# Patient Record
Sex: Male | Born: 1970 | Race: Black or African American | Hispanic: No | Marital: Married | Smoking: Never smoker
Health system: Southern US, Community
[De-identification: ages and names within clinical notes are randomized; demographics above are authoritative.]

## PROBLEM LIST (undated history)

## (undated) DIAGNOSIS — N529 Male erectile dysfunction, unspecified: Secondary | ICD-10-CM

## (undated) DIAGNOSIS — A549 Gonococcal infection, unspecified: Secondary | ICD-10-CM

## (undated) DIAGNOSIS — R7303 Prediabetes: Secondary | ICD-10-CM

## (undated) DIAGNOSIS — E559 Vitamin D deficiency, unspecified: Secondary | ICD-10-CM

## (undated) HISTORY — DX: Vitamin D deficiency, unspecified: E55.9

## (undated) HISTORY — DX: Prediabetes: R73.03

## (undated) HISTORY — DX: Male erectile dysfunction, unspecified: N52.9

## (undated) HISTORY — PX: CYST EXCISION: SHX5701

---

## 1998-12-07 ENCOUNTER — Emergency Department (HOSPITAL_COMMUNITY): Admission: EM | Admit: 1998-12-07 | Discharge: 1998-12-07 | Payer: Self-pay | Admitting: Internal Medicine

## 2009-04-28 ENCOUNTER — Emergency Department (HOSPITAL_COMMUNITY): Admission: EM | Admit: 2009-04-28 | Discharge: 2009-04-28 | Payer: Self-pay | Admitting: Emergency Medicine

## 2009-05-01 ENCOUNTER — Emergency Department (HOSPITAL_COMMUNITY): Admission: EM | Admit: 2009-05-01 | Discharge: 2009-05-01 | Payer: Self-pay | Admitting: Internal Medicine

## 2009-07-08 ENCOUNTER — Emergency Department (HOSPITAL_COMMUNITY)
Admission: EM | Admit: 2009-07-08 | Discharge: 2009-07-08 | Payer: Self-pay | Source: Home / Self Care | Admitting: Emergency Medicine

## 2009-10-10 ENCOUNTER — Emergency Department (HOSPITAL_COMMUNITY): Admission: EM | Admit: 2009-10-10 | Discharge: 2009-10-10 | Payer: Self-pay | Admitting: Emergency Medicine

## 2010-04-27 ENCOUNTER — Emergency Department (HOSPITAL_COMMUNITY)
Admission: EM | Admit: 2010-04-27 | Discharge: 2010-04-27 | Payer: Self-pay | Source: Home / Self Care | Admitting: Emergency Medicine

## 2010-05-10 ENCOUNTER — Emergency Department (HOSPITAL_COMMUNITY)
Admission: EM | Admit: 2010-05-10 | Discharge: 2010-05-10 | Payer: Self-pay | Source: Home / Self Care | Admitting: Emergency Medicine

## 2010-10-29 DIAGNOSIS — L723 Sebaceous cyst: Secondary | ICD-10-CM | POA: Insufficient documentation

## 2010-10-29 DIAGNOSIS — H61119 Acquired deformity of pinna, unspecified ear: Secondary | ICD-10-CM | POA: Insufficient documentation

## 2011-01-26 DIAGNOSIS — J309 Allergic rhinitis, unspecified: Secondary | ICD-10-CM | POA: Insufficient documentation

## 2011-02-01 DIAGNOSIS — D72819 Decreased white blood cell count, unspecified: Secondary | ICD-10-CM | POA: Insufficient documentation

## 2011-02-02 ENCOUNTER — Encounter (HOSPITAL_BASED_OUTPATIENT_CLINIC_OR_DEPARTMENT_OTHER)
Admission: RE | Admit: 2011-02-02 | Discharge: 2011-02-02 | Disposition: A | Discharge status: Home / Self Care | Payer: Medicaid Other | Source: Ambulatory Visit | Attending: Plastic Surgery | Admitting: Plastic Surgery

## 2011-02-03 ENCOUNTER — Ambulatory Visit (HOSPITAL_BASED_OUTPATIENT_CLINIC_OR_DEPARTMENT_OTHER)
Admission: RE | Admit: 2011-02-03 | Discharge: 2011-02-03 | Disposition: A | Discharge status: Home / Self Care | Payer: Medicaid Other | Source: Ambulatory Visit | Attending: Plastic Surgery | Admitting: Plastic Surgery

## 2011-02-03 DIAGNOSIS — Z01810 Encounter for preprocedural cardiovascular examination: Secondary | ICD-10-CM | POA: Insufficient documentation

## 2011-02-03 DIAGNOSIS — J449 Chronic obstructive pulmonary disease, unspecified: Secondary | ICD-10-CM | POA: Insufficient documentation

## 2011-02-03 DIAGNOSIS — M9510 Cauliflower ear, unspecified ear: Secondary | ICD-10-CM | POA: Insufficient documentation

## 2011-02-03 DIAGNOSIS — Z01812 Encounter for preprocedural laboratory examination: Secondary | ICD-10-CM | POA: Insufficient documentation

## 2011-02-03 DIAGNOSIS — L723 Sebaceous cyst: Secondary | ICD-10-CM | POA: Insufficient documentation

## 2011-02-03 DIAGNOSIS — J4489 Other specified chronic obstructive pulmonary disease: Secondary | ICD-10-CM | POA: Insufficient documentation

## 2011-02-04 ENCOUNTER — Other Ambulatory Visit: Payer: Self-pay | Admitting: Plastic Surgery

## 2011-02-04 NOTE — Op Note (Signed)
  NAME:  Barry Wood, Barry Wood NO.:  [id number]  MEDICAL RECORD NO.:  [id number]  LOCATION:  [facility]   FACILITY:[facility]  PHYSICIAN:  Wayland Denis, DO      DATE OF BIRTH:  1970-06-13  DATE OF PROCEDURE:  02/03/2011 DATE OF DISCHARGE:                              OPERATIVE REPORT   PREOPERATIVE DIAGNOSES: 1. Forehead cyst. 2. Scarred cartilage cauliflower ear, right.  POSTOPERATIVE DIAGNOSES: 1. Forehead cyst. 2. Scarred cartilage cauliflower ear, right.  PROCEDURE:  Excision of forehead cyst 1 cm and excision of antihelix, right ear cartilage.  ATTENDING:  Wayland Denis, DO  ANESTHESIA:  General.  INDICATIONS FOR PROCEDURE:  The patient is a 40 year old black male who has noticed a growing cyst of the forehead over the last year.  He also required a cauliflower ear for multiple injuries secondary to wrestling when he was in high school.  The decision was made to excise the excess portion of the cartilage.  He did not want  cartilage graft or rib graft, but only improvement based on what could be done locally and excision of the forehead cyst to identify the nature of the cyst.  He was seen in the holding, risks and complications were explained.  DESCRIPTION OF PROCEDURE:  The patient was taken to the operating room, placed on the operating table in the supine position.  General anesthesia was administered once adequate time-out was called, all information was confirmed to be correct.  His head was prepped and draped with Betadine in the usual sterile fashion including the right ear and forehead.  He was draped.  Local with epinephrine was injected around the cyst and around the antihelix of the right ear.  The 15-blade was used to make a linear incision over the cyst and the tenotomies were used to dissect down around it, it was very firm and the entire capsule was removed.  Hemostasis was achieved using electrocautery.  The incision was closed  with 6-0 Prolene simple and interrupted sutures.  A dry 2 x 2 gauze was applied.  Attention was then turned towards the right ear, 15-blade was used to make an incision along the anterior inner portion of the helix at the junction of the helix and antihelix. The tenotomies were used to dissect the skin free from the antihelix. The bur was used to create valley between the helix and the antihelix. A 15-blade was used to excise the excess portion of the antihelix anterior and posterior roots.  It was then smoothed with the bur.  The incision was closed with running 6-0 Prolene and simple interrupted 6-0 Prolene.  Xeroform was then tucked into the fold and secured with 4-0 nylon with Xeroform behind the ear.  A dry dressing with 4x4 gauze was applied and taped to both the cyst gauze and the ear gauze and the cyst was sent to Pathology.  The patient tolerated the procedure well.  There were no complications.  He was awoken and taken to recovery room in stable condition.     Wayland Denis, DO     CS/MEDQ  D:  02/03/2011  T:  02/04/2011  Job:  [id number] Electronically Signed by Wayland Denis  on 02/04/2011 03:40:01 PM

## 2011-03-03 DIAGNOSIS — M542 Cervicalgia: Secondary | ICD-10-CM | POA: Insufficient documentation

## 2011-08-11 ENCOUNTER — Encounter (HOSPITAL_COMMUNITY): Payer: Self-pay | Admitting: Emergency Medicine

## 2011-08-11 ENCOUNTER — Emergency Department (INDEPENDENT_AMBULATORY_CARE_PROVIDER_SITE_OTHER)
Admission: EM | Admit: 2011-08-11 | Discharge: 2011-08-11 | Disposition: A | Discharge status: Home / Self Care | Payer: Self-pay | Source: Home / Self Care | Attending: Emergency Medicine | Admitting: Emergency Medicine

## 2011-08-11 DIAGNOSIS — N489 Disorder of penis, unspecified: Secondary | ICD-10-CM

## 2011-08-11 DIAGNOSIS — R21 Rash and other nonspecific skin eruption: Secondary | ICD-10-CM

## 2011-08-11 HISTORY — DX: Gonococcal infection, unspecified: A54.9

## 2011-08-11 LAB — RPR: RPR Ser Ql: NONREACTIVE

## 2011-08-11 MED ORDER — PENICILLIN G BENZATHINE 1200000 UNIT/2ML IM SUSP
INTRAMUSCULAR | Status: AC
  Filled 2011-08-11: qty 4

## 2011-08-11 MED ORDER — PENICILLIN G BENZATHINE 1200000 UNIT/2ML IM SUSP
2.4000 10*6.[IU] | Freq: Once | INTRAMUSCULAR | Status: AC
  Administered 2011-08-11: 2.4 10*6.[IU] via INTRAMUSCULAR

## 2011-08-11 NOTE — Discharge Instructions (Signed)
Give [place] a working phone number so that we can contact you if needed. You will need to return here in person for some of your lab results. If any of your labs come back positive he will need to have your partner(s) are treated as well. Refrain from sexual contact until you know all of your results, and your symptoms resolve. Return for fever above 100.4, if you get worse, if you've difficulty urinating, or for any other concerns. You also need to find a primary care physician for routine health care.  Go to www.goodrx.com to look up your medications. This will give you a list of where you can find your prescriptions at the most affordable prices.   RESOURCE GUIDE  No Primary Care Doctor Call [facility]  [phone] Other agencies that provide inexpensive medical care    [facility]  [phone]    [facility]  [phone]    [facility]  [phone]    [facility]  [phone] [address] [place] [address]    [facility]  [phone]    [facility]  [phone] [facility] [phone]   [address] [place] [address] Suite [place], [place] [address]   [facility] Resources  [facility]     [employer]                          [facility]. [address] [place]                       [address]      [address] [place] [address]   [phone] (After Hours)

## 2011-08-11 NOTE — ED Provider Notes (Signed)
History     CSN: [id number]  Arrival date & time 08/11/11  4098   First MD Initiated Contact with Patient 08/11/11 0820      Chief Complaint  Patient presents with  . Rash    (Consider location/radiation/quality/duration/timing/severity/associated sxs/prior treatment) HPI Comments: Patient with a painless, circular lesion on his penis appeared about 2 weeks ago and then resolved. Patient states that the rash came back, now with more lesions. Has been applying peroxide, alcohol, bacitracin on this, and states that the rash is now painful because of this. No blisters, penile discharge, nausea, vomiting, fevers, other rash, sore throat, or inguinal lymphadenopathy. Patient sexually active with his wife, who has a [employer] fast streamer. Patient states that he feels the IUD when they have intercourse, but is not sure if this is causing his symptoms. She is asymptomatic. He denies sexual contact with anybody else. States that he is not using any condoms, no new lotions, soaps, or detergents. Patient has remote history of gonorrhea. No history of Chlamydia, herpes, HIV, syphilis.  ROS as noted in HPI. All other ROS negative.   Patient is a 41 y.o. male presenting with penile discharge. The history is provided by the patient.  Penile Discharge This is a recurrent problem. The current episode started more than 1 week ago. The problem occurs constantly. Pertinent negatives include no abdominal pain. The symptoms are aggravated by nothing. The symptoms are relieved by nothing. Treatments tried: Peroxide, alcohol. The treatment provided no relief.    Past Medical History  Diagnosis Date  . Gonorrhea     History reviewed. No pertinent past surgical history.  History reviewed. No pertinent family history.  History  Substance Use Topics  . Smoking status: Never Smoker   . Smokeless tobacco: Not on file  . Alcohol Use: Yes      Review of Systems  Constitutional: Negative for fever and unexpected weight  change.  Gastrointestinal: Negative for abdominal pain.  Genitourinary: Negative for dysuria, frequency, flank pain, discharge, penile swelling, scrotal swelling, difficulty urinating, penile pain and testicular pain.  Skin: Positive for rash.  Hematological: Negative for adenopathy.    Allergies  Review of patient's allergies indicates no known allergies.  Home Medications   Current Outpatient Rx  Name Route Sig Dispense Refill  . BACITRACIN-NEOMYCIN-POLYMYXIN [id number] EX OINT Topical Apply topically every 12 (twelve) hours. apply to eye      BP 109/68  Pulse 68  Temp(Src) 98.2 F (36.8 C) (Oral)  Resp 16  SpO2 99%  Physical Exam  Nursing note and vitals reviewed. Constitutional: He is oriented to person, place, and time. He appears well-developed and well-nourished.  HENT:  Head: Normocephalic and atraumatic.  Eyes: Conjunctivae and EOM are normal.  Neck: Normal range of motion.  Cardiovascular: Regular rhythm.   Pulmonary/Chest: Effort normal. No respiratory distress.  Abdominal: He exhibits no distension.  Genitourinary: Testes normal. Uncircumcised. No penile erythema or penile tenderness. No discharge found.       Flat, circular, hyperpigmented, nontender lesions on penis. No blisters. Glans  normal. No penile discharge. Pt declined chaperone.   Musculoskeletal: Normal range of motion. He exhibits no edema and no tenderness.  Lymphadenopathy:       Right: No inguinal adenopathy present.       Left: No inguinal adenopathy present.  Neurological: He is alert and oriented to person, place, and time.  Skin: Skin is warm and dry. No rash noted.       No rash on torso, arms, palms  of hand  Psychiatric: He has a normal mood and affect. His behavior is normal.    ED Course  Procedures (including critical care time)   Labs Reviewed  GC/CHLAMYDIA PROBE AMP, GENITAL  HERPES SIMPLEX VIRUS CULTURE  HIV ANTIBODY (ROUTINE TESTING)  RPR   No results found.   1.  Penile rash     MDM  Previous records reviewed, noncontributory.  Sending off gonorrhea, Chlamydia, GC culture, RPR, HIV. Patient states that the lesions are painless until he started using the alcohol and peroxide on it. Exam is suggestive syphilis. No rash on palms of hands, no inguinal lymphadenopathy. Does not appear to be herpes at this point in time. Giving 2.4 million units of penicillin IM for empiric treatment of syphilis ersed with. Will have patient return here for his results. Advised him to bring his partner in for treatment if any of his results come back positive.  Luiz Blare, MD 08/11/11 [phone]

## 2011-08-11 NOTE — ED Notes (Signed)
Rash onset two days ago.  Reports bumps, blisters.  Rash is painful, denies itching, reports burning.  Reports a previous episode 2 weeks ago, went away.  Then reocured in same spot.  Area is on penis

## 2011-08-11 NOTE — ED Notes (Signed)
Patient aware he will wait 20- 30 minutes post antibiotic injection.

## 2011-08-12 LAB — GC/CHLAMYDIA PROBE AMP, GENITAL: GC Probe Amp, Genital: NEGATIVE

## 2011-08-15 LAB — HERPES SIMPLEX VIRUS CULTURE: Culture: NOT DETECTED

## 2011-08-16 ENCOUNTER — Telehealth (HOSPITAL_COMMUNITY): Payer: Self-pay | Admitting: *Deleted
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
## 2011-08-17 ENCOUNTER — Telehealth (HOSPITAL_COMMUNITY): Payer: Self-pay | Admitting: *Deleted

## 2011-08-17 NOTE — ED Notes (Signed)
Pt. called back and given the information in the previous note. Pt. satisfied with the answer. No further questions. Barry Wood 08/17/2011

## 2011-08-17 NOTE — ED Notes (Signed)
Discussed with Dr. Chaney Malling and she said rash could have been a contact dermatitis.  Things that can cause dermatitis would be soaps, lubricants, condoms etc. I told her, that he thought it was the strings on the [employer]. and she said if they are short they could be sticking him.  Tell pt. it is nothing to worry about. She checked for all the things he would not want it to be like STD's. No sex until the rash clears up. I called pt. and left message to call. Vassie Moselle 08/17/2011

## 2012-05-29 ENCOUNTER — Emergency Department (INDEPENDENT_AMBULATORY_CARE_PROVIDER_SITE_OTHER)
Admission: EM | Admit: 2012-05-29 | Discharge: 2012-05-29 | Disposition: A | Discharge status: Home / Self Care | Payer: BC Managed Care – PPO | Source: Home / Self Care | Attending: Family Medicine | Admitting: Family Medicine

## 2012-05-29 ENCOUNTER — Encounter (HOSPITAL_COMMUNITY): Payer: Self-pay

## 2012-05-29 DIAGNOSIS — J111 Influenza due to unidentified influenza virus with other respiratory manifestations: Secondary | ICD-10-CM

## 2012-05-29 MED ORDER — ACETAMINOPHEN 500 MG PO TABS
1000.0000 mg | ORAL_TABLET | Freq: Once | ORAL | Status: AC
  Administered 2012-05-29: 1000 mg via ORAL

## 2012-05-29 MED ORDER — ACETAMINOPHEN 325 MG PO TABS
ORAL_TABLET | ORAL | Status: AC
  Filled 2012-05-29: qty 3

## 2012-05-29 NOTE — ED Notes (Signed)
Cough, HA, congested; fever on evaluation

## 2012-05-29 NOTE — ED Provider Notes (Signed)
History     CSN: [id number]  Arrival date & time 05/29/12  1712   First MD Initiated Contact with Patient 05/29/12 1716      Chief Complaint  Patient presents with  . Cough    (Consider location/radiation/quality/duration/timing/severity/associated sxs/prior treatment) Patient is a 42 y.o. male presenting with cough. The history is provided by the patient.  Cough This is a new problem. The current episode started more than 2 days ago. The problem has been gradually worsening. The cough is non-productive. The maximum temperature recorded prior to his arrival was 101 to 101.9 F. Associated symptoms include chills, rhinorrhea and myalgias. Pertinent negatives include no sore throat, no shortness of breath and no wheezing.    Past Medical History  Diagnosis Date  . Gonorrhea     History reviewed. No pertinent past surgical history.  History reviewed. No pertinent family history.  History  Substance Use Topics  . Smoking status: Never Smoker   . Smokeless tobacco: Not on file  . Alcohol Use: Yes      Review of Systems  Constitutional: Positive for fever and chills.  HENT: Positive for congestion, rhinorrhea and postnasal drip. Negative for sore throat and trouble swallowing.   Respiratory: Positive for cough. Negative for shortness of breath and wheezing.   Gastrointestinal: Positive for nausea. Negative for vomiting and diarrhea.  Musculoskeletal: Positive for myalgias.    Allergies  Review of patient's allergies indicates no known allergies.  Home Medications   Current Outpatient Rx  Name  Route  Sig  Dispense  Refill  . BACITRACIN-NEOMYCIN-POLYMYXIN [id number] EX OINT   Topical   Apply topically every 12 (twelve) hours. apply to eye           BP 113/79  Pulse 73  Temp 101 F (38.3 C) (Oral)  Resp 18  SpO2 98%  Physical Exam  Nursing note and vitals reviewed. Constitutional: He is oriented to person, place, and time. He appears well-developed and  well-nourished.  HENT:  Head: Normocephalic.  Right Ear: External ear normal.  Left Ear: External ear normal.  Nose: Mucosal edema and rhinorrhea present.  Mouth/Throat: Oropharynx is clear and moist.  Eyes: Pupils are equal, round, and reactive to light.  Neck: Normal range of motion. Neck supple.  Cardiovascular: Normal rate, regular rhythm, normal heart sounds and intact distal pulses.   Pulmonary/Chest: Effort normal and breath sounds normal.  Abdominal: Soft. Bowel sounds are normal.  Lymphadenopathy:    He has no cervical adenopathy.  Neurological: He is alert and oriented to person, place, and time.  Skin: Skin is warm and dry.    ED Course  Procedures (including critical care time)  Labs Reviewed - No data to display No results found.   1. Influenza-like illness       MDM          Linna Hoff, MD 05/29/12 [phone]

## 2016-10-04 ENCOUNTER — Ambulatory Visit: Payer: Self-pay | Admitting: Family Medicine

## 2017-01-12 DIAGNOSIS — R002 Palpitations: Secondary | ICD-10-CM | POA: Diagnosis not present

## 2017-01-12 DIAGNOSIS — J301 Allergic rhinitis due to pollen: Secondary | ICD-10-CM | POA: Diagnosis not present

## 2018-04-17 DIAGNOSIS — N529 Male erectile dysfunction, unspecified: Secondary | ICD-10-CM | POA: Diagnosis not present

## 2018-04-17 DIAGNOSIS — Z13220 Encounter for screening for lipoid disorders: Secondary | ICD-10-CM | POA: Diagnosis not present

## 2018-04-17 DIAGNOSIS — Z1329 Encounter for screening for other suspected endocrine disorder: Secondary | ICD-10-CM | POA: Diagnosis not present

## 2018-04-17 DIAGNOSIS — R5382 Chronic fatigue, unspecified: Secondary | ICD-10-CM | POA: Diagnosis not present

## 2018-04-17 DIAGNOSIS — Z131 Encounter for screening for diabetes mellitus: Secondary | ICD-10-CM | POA: Diagnosis not present

## 2018-04-17 DIAGNOSIS — H6062 Unspecified chronic otitis externa, left ear: Secondary | ICD-10-CM | POA: Diagnosis not present

## 2018-04-17 DIAGNOSIS — H6092 Unspecified otitis externa, left ear: Secondary | ICD-10-CM | POA: Diagnosis not present

## 2018-04-17 DIAGNOSIS — E559 Vitamin D deficiency, unspecified: Secondary | ICD-10-CM | POA: Diagnosis not present

## 2018-07-05 ENCOUNTER — Other Ambulatory Visit: Payer: Self-pay

## 2018-07-05 ENCOUNTER — Encounter (HOSPITAL_COMMUNITY): Payer: Self-pay | Admitting: Emergency Medicine

## 2018-07-05 ENCOUNTER — Emergency Department (HOSPITAL_COMMUNITY): Payer: Medicaid Other

## 2018-07-05 ENCOUNTER — Emergency Department (HOSPITAL_COMMUNITY)
Admission: EM | Admit: 2018-07-05 | Discharge: 2018-07-05 | Disposition: A | Discharge status: Home / Self Care | Payer: Medicaid Other | Attending: Emergency Medicine | Admitting: Emergency Medicine

## 2018-07-05 DIAGNOSIS — Y999 Unspecified external cause status: Secondary | ICD-10-CM | POA: Diagnosis not present

## 2018-07-05 DIAGNOSIS — Y929 Unspecified place or not applicable: Secondary | ICD-10-CM | POA: Diagnosis not present

## 2018-07-05 DIAGNOSIS — Y939 Activity, unspecified: Secondary | ICD-10-CM | POA: Insufficient documentation

## 2018-07-05 DIAGNOSIS — S161XXA Strain of muscle, fascia and tendon at neck level, initial encounter: Secondary | ICD-10-CM | POA: Diagnosis not present

## 2018-07-05 DIAGNOSIS — S199XXA Unspecified injury of neck, initial encounter: Secondary | ICD-10-CM | POA: Diagnosis not present

## 2018-07-05 DIAGNOSIS — S299XXA Unspecified injury of thorax, initial encounter: Secondary | ICD-10-CM | POA: Diagnosis not present

## 2018-07-05 DIAGNOSIS — Z79899 Other long term (current) drug therapy: Secondary | ICD-10-CM | POA: Diagnosis not present

## 2018-07-05 DIAGNOSIS — M546 Pain in thoracic spine: Secondary | ICD-10-CM | POA: Diagnosis not present

## 2018-07-05 DIAGNOSIS — M549 Dorsalgia, unspecified: Secondary | ICD-10-CM

## 2018-07-05 DIAGNOSIS — M542 Cervicalgia: Secondary | ICD-10-CM | POA: Diagnosis not present

## 2018-07-05 MED ORDER — METHOCARBAMOL 500 MG PO TABS: 500.0000 mg | ORAL_TABLET | Freq: Three times a day (TID) | ORAL | 0 refills | Status: DC

## 2018-07-05 MED ORDER — IBUPROFEN 600 MG PO TABS: 600.0000 mg | ORAL_TABLET | Freq: Three times a day (TID) | ORAL | 0 refills | Status: DC

## 2018-07-05 MED ORDER — TRAMADOL HCL 50 MG PO TABS: 50.0000 mg | ORAL_TABLET | Freq: Four times a day (QID) | ORAL | 0 refills | Status: DC | PRN
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
## 2018-07-05 NOTE — Discharge Instructions (Addendum)
Alternate ice and heat to your upper back and neck. Take the medication as directed.  Follow-up with your primary doctor for recheck if your symptoms are not improving in a few days.

## 2019-04-30 ENCOUNTER — Other Ambulatory Visit: Payer: Self-pay

## 2019-04-30 ENCOUNTER — Ambulatory Visit (INDEPENDENT_AMBULATORY_CARE_PROVIDER_SITE_OTHER): Payer: Medicaid Other | Admitting: Nurse Practitioner

## 2019-04-30 ENCOUNTER — Other Ambulatory Visit (INDEPENDENT_AMBULATORY_CARE_PROVIDER_SITE_OTHER): Payer: Self-pay | Admitting: Nurse Practitioner

## 2019-04-30 ENCOUNTER — Encounter (INDEPENDENT_AMBULATORY_CARE_PROVIDER_SITE_OTHER): Payer: Self-pay | Admitting: Nurse Practitioner

## 2019-04-30 DIAGNOSIS — Z13220 Encounter for screening for lipoid disorders: Secondary | ICD-10-CM | POA: Diagnosis not present

## 2019-04-30 DIAGNOSIS — N529 Male erectile dysfunction, unspecified: Secondary | ICD-10-CM | POA: Diagnosis not present

## 2019-04-30 DIAGNOSIS — E559 Vitamin D deficiency, unspecified: Secondary | ICD-10-CM | POA: Diagnosis not present

## 2019-04-30 DIAGNOSIS — Z0001 Encounter for general adult medical examination with abnormal findings: Secondary | ICD-10-CM | POA: Diagnosis not present

## 2019-04-30 DIAGNOSIS — Z113 Encounter for screening for infections with a predominantly sexual mode of transmission: Secondary | ICD-10-CM | POA: Diagnosis not present

## 2019-04-30 DIAGNOSIS — Z139 Encounter for screening, unspecified: Secondary | ICD-10-CM | POA: Diagnosis not present

## 2019-04-30 DIAGNOSIS — Z131 Encounter for screening for diabetes mellitus: Secondary | ICD-10-CM | POA: Diagnosis not present

## 2019-04-30 DIAGNOSIS — Z1329 Encounter for screening for other suspected endocrine disorder: Secondary | ICD-10-CM | POA: Diagnosis not present

## 2019-04-30 NOTE — Progress Notes (Signed)
Subjective:  Patient ID: Barry Wood, male    DOB: 01/29/1971  Age: 48 y.o. MRN: [id number]  CC:  Chief Complaint  Patient presents with  . Annual Exam      HPI  This patient arrives today for annual physical exam.    He is due for flu and tetanus shot.  He had sexual transmitted infection screening completed in 2013, but would be willing to undergo additional screening today.  He does not smoke cigarettes.  Depression screening is due today.  He is due for diabetes screening.  He would qualify for hepatitis C screening.  He also complains of erectile dysfunction that has been bothering him for some time now.  He believes he may have had low testosterone in the past.  He describes difficulty achieving and maintaining an erection.  He does tell me he is able to ejaculate.  Past Medical History:  Diagnosis Date  . Erectile dysfunction   . Gonorrhea       Family History  Problem Relation Age of Onset  . Heart disease Mother   . Cancer Mother        Kidney  . Heart disease Father     Social History   Social History Narrative  . Not on file   Social History   Tobacco Use  . Smoking status: Never Smoker  . Smokeless tobacco: Never Used  Substance Use Topics  . Alcohol use: Yes    Alcohol/week: 4.0 standard drinks    Types: 4 Shots of liquor per week    Comment: Occasional     No outpatient medications have been marked as taking for the 04/30/19 encounter (Office Visit) with Ailene Ards, NP.    ROS:  Review of Systems  Genitourinary:       Difficulty achieving and maintaining an erection. Negative for difficulties/abnormalties associated with ejaculation.  All other systems reviewed and are negative.    Objective:   Today's Vitals: BP 130/76   Pulse 70   Temp 97.9 F (36.6 C)   Resp 14   Ht 5\' 9"  (1.753 m)   Wt 194 lb 9.6 oz (88.3 kg)   SpO2 99%   BMI 28.74 kg/m  Vitals with BMI 04/30/2019 07/05/2018 05/29/2012  Height 5\' 9"  5\' 9"  -   Weight 194 lbs 10 oz 185 lbs -  BMI 10.17 51.02 -  Systolic 585 277 824  Diastolic 76 70 79  Pulse 70 68 73     Physical Exam Vitals signs reviewed.  Constitutional:      Appearance: Normal appearance.  HENT:     Head: Normocephalic and atraumatic.  Eyes:     General: No scleral icterus.       Right eye: No discharge.        Left eye: No discharge.     Extraocular Movements: Extraocular movements intact.     Conjunctiva/sclera: Conjunctivae normal.  Neck:     Musculoskeletal: Neck supple.  Cardiovascular:     Rate and Rhythm: Normal rate and regular rhythm.  Pulmonary:     Effort: Pulmonary effort is normal.     Breath sounds: Normal breath sounds.  Abdominal:     General: Abdomen is flat. Bowel sounds are normal. There is no distension.     Palpations: Abdomen is soft. There is no mass.     Tenderness: There is no abdominal tenderness.  Musculoskeletal: Normal range of motion.  Skin:    General: Skin is warm  and dry.  Neurological:     Mental Status: He is alert and oriented to person, place, and time.  Psychiatric:        Mood and Affect: Mood normal.        Behavior: Behavior normal.        Thought Content: Thought content normal.        Judgment: Judgment normal.          Assessment   1. Encounter for general adult medical examination with abnormal findings   2. Erectile dysfunction, unspecified erectile dysfunction type       Tests ordered No orders of the defined types were placed in this encounter.    Plan: Please see assessment and plan per problem list below.   No orders of the defined types were placed in this encounter.   Patient to follow-up in 1 month to discuss his lab results and determine treatment plan for erectile dysfunction.  He will be due for his annual physical exam in 1 year.[name]  Elenore Paddy, NP

## 2019-04-30 NOTE — Assessment & Plan Note (Signed)
Patient declined to have flu shot today.  Tetanus shot will be administered.  Blood work will be collected today including sexual transmitted infection screening, CBC, CMP, lipid panel, A1c, TSH, vitamin D level, and testosterone level.  Depression screening was negative today.  We will hold off on hepatitis C screening per patient preference.

## 2019-04-30 NOTE — Assessment & Plan Note (Addendum)
We will collect testosterone level for further evaluation today.  Further recommendations may be made based upon those results.  I did discuss with the patient that there are multiple possible etiologies of his erectile dysfunction.  These can include psychiatric, hormonal, and vascular etiologies.  It may include further follow-up and testing to determine the etiology.  He tells me he understands.

## 2019-05-01 LAB — HEMOGLOBIN A1C W/OUT EAG: Hgb A1c MFr Bld: 5.8 % of total Hgb — ABNORMAL HIGH (ref <5.7)

## 2019-05-02 ENCOUNTER — Telehealth (INDEPENDENT_AMBULATORY_CARE_PROVIDER_SITE_OTHER): Payer: Self-pay | Admitting: Nurse Practitioner

## 2019-05-02 NOTE — Telephone Encounter (Signed)
I attempted to call this patient today to discuss his lab work results over the phone.  As well as to reschedule him for office visit.  He did not answer the phone but I did leave a message asking him to call us back.

## 2019-05-13 ENCOUNTER — Telehealth (INDEPENDENT_AMBULATORY_CARE_PROVIDER_SITE_OTHER): Payer: Self-pay | Admitting: Nurse Practitioner

## 2019-05-13 NOTE — Telephone Encounter (Signed)
I attempted to call this patient today to discuss his lab results.  I also needed to call him to reschedule his next follow-up.  He did not answer the phone but I did leave a message asking him to call us back.

## 2019-06-06 ENCOUNTER — Encounter (INDEPENDENT_AMBULATORY_CARE_PROVIDER_SITE_OTHER): Payer: Self-pay | Admitting: Internal Medicine

## 2019-06-06 ENCOUNTER — Ambulatory Visit (INDEPENDENT_AMBULATORY_CARE_PROVIDER_SITE_OTHER): Payer: Medicaid Other | Admitting: Internal Medicine

## 2019-06-06 DIAGNOSIS — R7303 Prediabetes: Secondary | ICD-10-CM | POA: Diagnosis not present

## 2019-06-06 DIAGNOSIS — N529 Male erectile dysfunction, unspecified: Secondary | ICD-10-CM

## 2019-06-06 DIAGNOSIS — E559 Vitamin D deficiency, unspecified: Secondary | ICD-10-CM

## 2019-06-06 NOTE — Progress Notes (Signed)
Metrics: Intervention Frequency ACO  Documented Smoking Status Yearly  Screened one or more times in 24 months  Cessation Counseling or  Active cessation medication Past 24 months  Past 24 months   Guideline developer: UpToDate (See UpToDate for funding source) Date Released: 2014       Wellness Office Visit  Subjective:  Patient ID: Barry Wood, male    DOB: 12-21-1970  Age: 49 y.o. MRN: [id number]  CC: This is an audio telemedicine visit with the permission of the patient who is at home and I am in my office.  I was able to easily recognize his voice on the phone. This visit is to discuss his blood work from his annual physical exam he had with [name]. HPI I discussed all his blood work in detail.  He is prediabetic with a hemoglobin A1c of 5.8%.  He also has vitamin D deficiency. He also has suboptimal testosterone levels. He has mild hyperlipidemia.  Past Medical History:  Diagnosis Date  . Erectile dysfunction   . Gonorrhea       Family History  Problem Relation Age of Onset  . Heart disease Mother   . Cancer Mother        Kidney  . Heart disease Father     Social History   Social History Narrative  . Not on file   Social History   Tobacco Use  . Smoking status: Never Smoker  . Smokeless tobacco: Never Used  Substance Use Topics  . Alcohol use: Yes    Alcohol/week: 4.0 standard drinks    Types: 4 Shots of liquor per week    Comment: Occasional    Current Meds  Medication Sig  . [DISCONTINUED] ibuprofen (ADVIL,MOTRIN) 600 MG tablet Take 1 tablet (600 mg total) by mouth 3 (three) times daily.  . [DISCONTINUED] methocarbamol (ROBAXIN) 500 MG tablet Take 1 tablet (500 mg total) by mouth 3 (three) times daily.  . [DISCONTINUED] neomycin-bacitracin-polymyxin (NEOSPORIN) ointment Apply topically every 12 (twelve) hours. apply to eye  . [DISCONTINUED] traMADol (ULTRAM) 50 MG tablet Take 1 tablet (50 mg total) by mouth every 6 (six) hours as needed.       Objective:   Today's Vitals: There were no vitals taken for this visit. Vitals with BMI 04/30/2019 07/05/2018 05/29/2012  Height 5\' 9"  5\' 9"  -  Weight 194 lbs 10 oz 185 lbs -  BMI 10.25 85.27 -  Systolic 782 423 536  Diastolic 76 70 79  Pulse 70 68 73     Physical Exam   His speech was normal on the phone and he appeared to be alert and orientated.    Assessment   1. Erectile dysfunction, unspecified erectile dysfunction type   2. Prediabetes   3. Vitamin D deficiency disease       Tests ordered No orders of the defined types were placed in this encounter.    Plan: 1. I recommended that he start taking vitamin D3 10,000 units daily. 2. We will need to work on diet in terms of his prediabetic state and we will discuss this in person on the next visit. 3. Also, he may benefit from testosterone therapy and we have addressed this issue more than 2 years ago but I think he was lost to follow-up. 4. Therefore, I will see him in the next 2 to 3 weeks time to discuss testosterone therapy in more detail. 5. This phone call lasted 10 minutes.   No orders of the defined types were  placed in this encounter.   Wilson Singer, MD

## 2019-07-03 ENCOUNTER — Ambulatory Visit (INDEPENDENT_AMBULATORY_CARE_PROVIDER_SITE_OTHER): Payer: Medicaid Other | Admitting: Internal Medicine

## 2019-07-03 ENCOUNTER — Other Ambulatory Visit: Payer: Self-pay

## 2019-07-03 ENCOUNTER — Encounter (INDEPENDENT_AMBULATORY_CARE_PROVIDER_SITE_OTHER): Payer: Self-pay | Admitting: Internal Medicine

## 2019-07-03 VITALS — BP 130/80 | HR 64 | Temp 97.2°F | Resp 18 | Ht 69.0 in | Wt 199.8 lb

## 2019-07-03 DIAGNOSIS — E559 Vitamin D deficiency, unspecified: Secondary | ICD-10-CM | POA: Diagnosis not present

## 2019-07-03 DIAGNOSIS — Z125 Encounter for screening for malignant neoplasm of prostate: Secondary | ICD-10-CM | POA: Diagnosis not present

## 2019-07-03 DIAGNOSIS — R7303 Prediabetes: Secondary | ICD-10-CM | POA: Diagnosis not present

## 2019-07-03 DIAGNOSIS — N529 Male erectile dysfunction, unspecified: Secondary | ICD-10-CM | POA: Diagnosis not present

## 2019-07-03 MED ORDER — TADALAFIL 20 MG PO TABS: 20.0000 mg | ORAL_TABLET | ORAL | 6 refills | Status: DC | PRN

## 2019-07-03 NOTE — Progress Notes (Signed)
Metrics: Intervention Frequency ACO  Documented Smoking Status Yearly  Screened one or more times in 24 months  Cessation Counseling or  Active cessation medication Past 24 months  Past 24 months   Guideline developer: UpToDate (See UpToDate for funding source) Date Released: 2014       Wellness Office Visit  Subjective:  Patient ID: Barry Wood, male    DOB: Jun 26, 1970  Age: 49 y.o. MRN: [id number]  CC: Erectile dysfunction HPI This man was seen by [name] previously and he has vitamin D deficiency and I recommended he start taking vitamin D3 10,000 units daily which she has been taking. His main complaint has been erectile dysfunction which she has had for some time.  He says he cannot get a hard erection which will last. He is also prediabetic and we have discussed to some degree nutrition in the past. In the past, his testosterone levels were mid normal range.  He has not had them checked for more than 2 years.  Past Medical History:  Diagnosis Date  . Erectile dysfunction   . Gonorrhea       Family History  Problem Relation Age of Onset  . Heart disease Mother   . Cancer Mother        Kidney  . 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   Social History   Tobacco Use  . Smoking status: Never Smoker  . Smokeless tobacco: Never Used  Substance Use Topics  . Alcohol use: Yes    Alcohol/week: 4.0 standard drinks    Types: 4 Shots of liquor per week    Comment: Occasional    Current Meds  Medication Sig  . Cholecalciferol (VITAMIN D-3) 125 MCG (5000 UT) TABS Take 2 tablets by mouth daily.       Objective:   Today's Vitals: BP 130/80 (BP Location: Left Arm, Patient Position: Sitting, Cuff Size: Normal)   Pulse 64   Temp (!) 97.2 F (36.2 C) (Temporal)   Resp 18   Ht 5\' 9"  (1.753 m)   Wt 199 lb 12.8 oz (90.6 kg)   SpO2 98% Comment: wearing mask   BMI 29.51 kg/m  Vitals with BMI 07/03/2019 06/06/2019 04/30/2019  Height 5\' 9"  (No Data) 5\' 9"   Weight 199 lbs 13 oz (No Data) 194 lbs 10 oz  BMI 56.43 - 32.95  Systolic 188 (No Data) 416  Diastolic 80 (No Data) 76  Pulse 64 - 70     Physical Exam  He looks systemically well.  No new physical findings.     Assessment   1. Erectile dysfunction, unspecified erectile dysfunction type   2. Prediabetes   3. Vitamin D deficiency disease   4. Special screening for malignant neoplasm of prostate       Tests ordered Orders Placed This Encounter  Procedures  . Testosterone Total,Free,Bio, Males  . PSA  . COMPLETE METABOLIC PANEL WITH GFR  . VITAMIN D 25 Hydroxy (Vit-D Deficiency, Fractures)     Plan: 1. Blood work is ordered as above. 2. I am going to try him on Cialis for erectile dysfunction to see if this will help him. 3. Further recommendations will depend on blood results. 4. Follow-up in 6 weeks time.   Meds ordered this encounter  Medications  . tadalafil (CIALIS) 20 MG tablet    Sig: Take 1 tablet (20 mg total) by mouth every other  day as needed for erectile dysfunction.    Dispense:  5 tablet    Refill:  6    Jacquelin Krajewski Normajean Glasgow, MD

## 2019-07-04 ENCOUNTER — Other Ambulatory Visit (INDEPENDENT_AMBULATORY_CARE_PROVIDER_SITE_OTHER): Payer: Self-pay

## 2019-07-04 LAB — COMPLETE METABOLIC PANEL WITH GFR
AG Ratio: 1.6 (calc) (ref 1.0–2.5)
ALT: 27 U/L (ref 9–46)
AST: 23 U/L (ref 10–40)
Albumin: 4.4 g/dL (ref 3.6–5.1)
Alkaline phosphatase (APISO): 76 U/L (ref 36–130)
BUN: 16 mg/dL (ref 7–25)
CO2: 28 mmol/L (ref 20–32)
Calcium: 9.5 mg/dL (ref 8.6–10.3)
Chloride: 104 mmol/L (ref 98–110)
Creat: 1.16 mg/dL (ref 0.60–1.35)
GFR, Est African American: 85 mL/min/{1.73_m2} (ref >=60)
GFR, Est Non African American: 74 mL/min/{1.73_m2} (ref >=60)
Globulin: 2.8 g/dL (calc) (ref 1.9–3.7)
Glucose, Bld: 89 mg/dL (ref 65–99)
Potassium: 4.2 mmol/L (ref 3.5–5.3)
Sodium: 138 mmol/L (ref 135–146)
Total Bilirubin: 0.4 mg/dL (ref 0.2–1.2)
Total Protein: 7.2 g/dL (ref 6.1–8.1)

## 2019-07-04 LAB — TESTOSTERONE TOTAL,FREE,BIO, MALES
Albumin: 4.4 g/dL (ref 3.6–5.1)
Sex Hormone Binding: 50 nmol/L (ref 10–50)
Testosterone, Bioavailable: 108.8 ng/dL — ABNORMAL LOW (ref >=110.0)
Testosterone, Free: 54.1 pg/mL (ref 46.0–224.0)
Testosterone: 564 ng/dL (ref 250–827)

## 2019-07-04 LAB — VITAMIN D 25 HYDROXY (VIT D DEFICIENCY, FRACTURES): Vit D, 25-Hydroxy: 20 ng/mL — ABNORMAL LOW (ref 30–100)

## 2019-07-04 LAB — PSA: PSA: 0.2 ng/mL (ref <=4.0)

## 2019-07-04 MED ORDER — TADALAFIL 20 MG PO TABS: 20.0000 mg | ORAL_TABLET | ORAL | 6 refills | Status: DC | PRN

## 2019-07-08 NOTE — Progress Notes (Signed)
Patient called.  Was given lab results on Vitamin D levels ar in 20s. He needs to take 10,000 units/ daily. He will take in morning with breakfast.

## 2019-08-12 ENCOUNTER — Encounter (INDEPENDENT_AMBULATORY_CARE_PROVIDER_SITE_OTHER): Payer: Self-pay | Admitting: Internal Medicine

## 2019-08-12 ENCOUNTER — Ambulatory Visit (INDEPENDENT_AMBULATORY_CARE_PROVIDER_SITE_OTHER): Payer: Medicaid Other | Admitting: Internal Medicine

## 2019-08-12 ENCOUNTER — Other Ambulatory Visit: Payer: Self-pay

## 2019-08-12 VITALS — BP 118/80 | HR 85 | Temp 98.8°F | Ht 69.0 in | Wt 198.0 lb

## 2019-08-12 DIAGNOSIS — E559 Vitamin D deficiency, unspecified: Secondary | ICD-10-CM | POA: Diagnosis not present

## 2019-08-12 DIAGNOSIS — N529 Male erectile dysfunction, unspecified: Secondary | ICD-10-CM | POA: Diagnosis not present

## 2019-08-12 LAB — COMPLETE METABOLIC PANEL WITH GFR
AG Ratio: 1.6 (calc) (ref 1.0–2.5)
ALT: 16 U/L (ref 9–46)
AST: 20 U/L (ref 10–40)
Albumin: 4.4 g/dL (ref 3.6–5.1)
Alkaline phosphatase (APISO): 62 U/L (ref 36–130)
BUN: 19 mg/dL (ref 7–25)
CO2: 27 mmol/L (ref 20–32)
Calcium: 9.7 mg/dL (ref 8.6–10.3)
Chloride: 105 mmol/L (ref 98–110)
Creat: 1.24 mg/dL (ref 0.60–1.35)
GFR, Est African American: 79 mL/min/{1.73_m2} (ref >=60)
GFR, Est Non African American: 68 mL/min/{1.73_m2} (ref >=60)
Globulin: 2.7 g/dL (calc) (ref 1.9–3.7)
Glucose, Bld: 106 mg/dL — ABNORMAL HIGH (ref 65–99)
Potassium: 4.4 mmol/L (ref 3.5–5.3)
Sodium: 139 mmol/L (ref 135–146)
Total Bilirubin: 0.4 mg/dL (ref 0.2–1.2)
Total Protein: 7.1 g/dL (ref 6.1–8.1)

## 2019-08-12 LAB — VITAMIN D 25 HYDROXY (VIT D DEFICIENCY, FRACTURES): Vit D, 25-Hydroxy: 45 ng/mL (ref 30–100)

## 2019-08-12 NOTE — Progress Notes (Signed)
Metrics: Intervention Frequency ACO  Documented Smoking Status Yearly  Screened one or more times in 24 months  Cessation Counseling or  Active cessation medication Past 24 months  Past 24 months   Guideline developer: UpToDate (See UpToDate for funding source) Date Released: 2014       Wellness Office Visit  Subjective:  Patient ID: Barry Wood, male    DOB: Jan 24, 1971  Age: 49 y.o. MRN: [id number]  CC: This man comes in for follow-up of vitamin D deficiency and erectile dysfunction. HPI  I had started him on Cialis on the last visit and this is helped him significantly. He has been taking vitamin D3 10,000 units daily.  He has tolerated this dose. Past Medical History:  Diagnosis Date  . Erectile dysfunction   . Gonorrhea       Family History  Problem Relation Age of Onset  . Heart disease Mother   . Cancer Mother        Kidney  . 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   Social History   Tobacco Use  . Smoking status: Never Smoker  . Smokeless tobacco: Never Used  Substance Use Topics  . Alcohol use: Yes    Alcohol/week: 4.0 standard drinks    Types: 4 Shots of liquor per week    Comment: Occasional    Current Meds  Medication Sig  . Cholecalciferol (VITAMIN D-3) 125 MCG (5000 UT) TABS Take 2 tablets by mouth daily.  . tadalafil (CIALIS) 20 MG tablet Take 1 tablet (20 mg total) by mouth every other day as needed for erectile dysfunction.       Objective:   Today's Vitals: BP 118/80 (BP Location: Left Arm, Patient Position: Sitting, Cuff Size: Normal)   Pulse 85   Temp 98.8 F (37.1 C) (Temporal)   Ht 5\' 9"  (1.753 m)   Wt 198 lb (89.8 kg)   SpO2 99%   BMI 29.24 kg/m  Vitals with BMI 08/12/2019 07/03/2019 06/06/2019  Height 5\' 9"  5\' 9"  (No Data)  Weight 198 lbs 199 lbs 13 oz (No Data)  BMI 29.23 29.49 -  Systolic 118 130 (No Data)    Diastolic 80 80 (No Data)  Pulse 85 64 -     Physical Exam  He looks systemically well, remains overweight.  No other new physical findings.     Assessment   1. Vitamin D deficiency disease   2. Erectile dysfunction, unspecified erectile dysfunction type       Tests ordered Orders Placed This Encounter  Procedures  . COMPLETE METABOLIC PANEL WITH GFR  . VITAMIN D 25 Hydroxy (Vit-D Deficiency, Fractures)     Plan: 1. Blood work is ordered above and he will continue with vitamin D3 10,000 units daily. 2. He will continue with Cialis for erectile dysfunction as needed. 3. Further recommendations will depend on blood results and I will see him in about 4 months time for follow-up.   No orders of the defined types were placed in this encounter.   06/08/2019, MD

## 2019-08-13 ENCOUNTER — Encounter (INDEPENDENT_AMBULATORY_CARE_PROVIDER_SITE_OTHER): Payer: Self-pay | Admitting: Internal Medicine

## 2019-12-17 ENCOUNTER — Other Ambulatory Visit: Payer: Self-pay

## 2019-12-17 ENCOUNTER — Ambulatory Visit (INDEPENDENT_AMBULATORY_CARE_PROVIDER_SITE_OTHER): Payer: Medicaid Other | Admitting: Internal Medicine

## 2019-12-17 ENCOUNTER — Encounter (INDEPENDENT_AMBULATORY_CARE_PROVIDER_SITE_OTHER): Payer: Self-pay | Admitting: Internal Medicine

## 2019-12-17 VITALS — BP 120/80 | HR 65 | Temp 97.9°F | Ht 69.0 in | Wt 189.2 lb

## 2019-12-17 DIAGNOSIS — E559 Vitamin D deficiency, unspecified: Secondary | ICD-10-CM | POA: Diagnosis not present

## 2019-12-17 DIAGNOSIS — N529 Male erectile dysfunction, unspecified: Secondary | ICD-10-CM

## 2019-12-17 MED ORDER — TADALAFIL 20 MG PO TABS: 20.0000 mg | ORAL_TABLET | ORAL | 3 refills | Status: DC | PRN

## 2019-12-17 NOTE — Progress Notes (Signed)
Metrics: Intervention Frequency ACO  Documented Smoking Status Yearly  Screened one or more times in 24 months  Cessation Counseling or  Active cessation medication Past 24 months  Past 24 months   Guideline developer: UpToDate (See UpToDate for funding source) Date Released: 2014       Wellness Office Visit  Subjective:  Patient ID: Barry Wood, male    DOB: 20-Oct-1970  Age: 49 y.o. MRN: [id number]  CC: This man comes in for follow-up of vitamin D deficiency and erectile dysfunction. HPI  I had given him a prescription for Cialis and he tells me that this is tremendously helped his erectile dysfunction. He continues on vitamin D 3 supplementation for vitamin D deficiency and is doing well with this. He is also improved his diet and has lost weight.  He plans to begin exercising on a regular basis. Past Medical History:  Diagnosis Date  . Erectile dysfunction   . Gonorrhea    Past Surgical History:  Procedure Laterality Date  . CYST EXCISION       Family History  Problem Relation Age of Onset  . Heart disease Mother   . Cancer Mother        Kidney  . 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   Social History   Tobacco Use  . Smoking status: Never Smoker  . Smokeless tobacco: Never Used  Substance Use Topics  . Alcohol use: Yes    Alcohol/week: 4.0 standard drinks    Types: 4 Shots of liquor per week    Comment: Occasional    Current Meds  Medication Sig  . Cholecalciferol (VITAMIN D-3) 125 MCG (5000 UT) TABS Take 2 tablets by mouth daily.  . tadalafil (CIALIS) 20 MG tablet Take 1 tablet (20 mg total) by mouth every other day as needed for erectile dysfunction.  . [DISCONTINUED] tadalafil (CIALIS) 20 MG tablet Take 1 tablet (20 mg total) by mouth every other day as needed for erectile dysfunction.     Depression screen [facility] 2/9 08/12/2019 04/30/2019    Decreased Interest 0 0  Down, Depressed, Hopeless 0 0  PHQ - 2 Score 0 0     Objective:   Today's Vitals: BP 120/80 (BP Location: Left Arm, Patient Position: Sitting, Cuff Size: Normal)   Pulse 65   Temp 97.9 F (36.6 C) (Temporal)   Ht 5\' 9"  (1.753 m)   Wt 189 lb 3.2 oz (85.8 kg)   SpO2 98%   BMI 27.94 kg/m  Vitals with BMI 12/17/2019 08/12/2019 07/03/2019  Height 5\' 9"  5\' 9"  5\' 9"   Weight 189 lbs 3 oz 198 lbs 199 lbs 13 oz  BMI 27.93 29.23 29.49  Systolic 120 118 08/31/2019  Diastolic 80 80 80  Pulse 65 85 64     Physical Exam   He looks systemically well.  He has lost 9 pounds in weight.  Blood pressure very good.  Alert and orientated without any focal neurological signs.    Assessment   1. Erectile dysfunction, unspecified erectile dysfunction type   2. Vitamin D deficiency disease       Tests ordered No orders of the defined types were placed in this encounter.    Plan: 1. I gave him another prescription of Cialis sufficient for his use. 2. He will continue with vitamin D3 supplementation for vitamin D deficiency. 3. I did briefly discuss testosterone therapy  and I think he may benefit from this.  He will discuss this further with his wife. 4. I will see him in December for an annual physical exam.   Meds ordered this encounter  Medications  . tadalafil (CIALIS) 20 MG tablet    Sig: Take 1 tablet (20 mg total) by mouth every other day as needed for erectile dysfunction.    Dispense:  60 tablet    Refill:  3    Camar Guyton Normajean Glasgow, MD

## 2020-01-31 DIAGNOSIS — Z23 Encounter for immunization: Secondary | ICD-10-CM | POA: Diagnosis not present

## 2020-02-21 DIAGNOSIS — Z23 Encounter for immunization: Secondary | ICD-10-CM | POA: Diagnosis not present

## 2020-05-11 ENCOUNTER — Encounter (INDEPENDENT_AMBULATORY_CARE_PROVIDER_SITE_OTHER): Payer: Medicaid Other | Admitting: Nurse Practitioner

## 2020-05-12 ENCOUNTER — Encounter (INDEPENDENT_AMBULATORY_CARE_PROVIDER_SITE_OTHER): Payer: Medicaid Other | Admitting: Internal Medicine

## 2020-05-18 ENCOUNTER — Encounter (INDEPENDENT_AMBULATORY_CARE_PROVIDER_SITE_OTHER): Payer: Medicaid Other | Admitting: Internal Medicine

## 2020-06-25 ENCOUNTER — Encounter (INDEPENDENT_AMBULATORY_CARE_PROVIDER_SITE_OTHER): Payer: Medicaid Other | Admitting: Internal Medicine

## 2020-07-23 ENCOUNTER — Encounter (INDEPENDENT_AMBULATORY_CARE_PROVIDER_SITE_OTHER): Payer: Medicaid Other | Admitting: Internal Medicine

## 2020-07-27 ENCOUNTER — Other Ambulatory Visit (INDEPENDENT_AMBULATORY_CARE_PROVIDER_SITE_OTHER): Payer: Self-pay | Admitting: Internal Medicine

## 2020-07-27 MED ORDER — TADALAFIL 20 MG PO TABS: 20.0000 mg | ORAL_TABLET | ORAL | 3 refills | Status: DC | PRN

## 2020-08-25 ENCOUNTER — Ambulatory Visit (INDEPENDENT_AMBULATORY_CARE_PROVIDER_SITE_OTHER): Payer: Medicaid Other | Admitting: Internal Medicine

## 2020-08-25 ENCOUNTER — Other Ambulatory Visit: Payer: Self-pay

## 2020-08-25 ENCOUNTER — Encounter (INDEPENDENT_AMBULATORY_CARE_PROVIDER_SITE_OTHER): Payer: Self-pay | Admitting: Internal Medicine

## 2020-08-25 VITALS — BP 122/78 | HR 66 | Temp 97.9°F | Ht 68.0 in | Wt 190.4 lb

## 2020-08-25 DIAGNOSIS — Z1211 Encounter for screening for malignant neoplasm of colon: Secondary | ICD-10-CM

## 2020-08-25 DIAGNOSIS — Z125 Encounter for screening for malignant neoplasm of prostate: Secondary | ICD-10-CM | POA: Diagnosis not present

## 2020-08-25 DIAGNOSIS — R5381 Other malaise: Secondary | ICD-10-CM | POA: Diagnosis not present

## 2020-08-25 DIAGNOSIS — N529 Male erectile dysfunction, unspecified: Secondary | ICD-10-CM | POA: Diagnosis not present

## 2020-08-25 DIAGNOSIS — R5383 Other fatigue: Secondary | ICD-10-CM | POA: Diagnosis not present

## 2020-08-25 DIAGNOSIS — R7303 Prediabetes: Secondary | ICD-10-CM | POA: Diagnosis not present

## 2020-08-25 DIAGNOSIS — E559 Vitamin D deficiency, unspecified: Secondary | ICD-10-CM

## 2020-08-25 DIAGNOSIS — Z1159 Encounter for screening for other viral diseases: Secondary | ICD-10-CM | POA: Diagnosis not present

## 2020-08-25 NOTE — Progress Notes (Signed)
Metrics: Intervention Frequency ACO  Documented Smoking Status Yearly  Screened one or more times in 24 months  Cessation Counseling or  Active cessation medication Past 24 months  Past 24 months   Guideline developer: UpToDate (See UpToDate for funding source) Date Released: 2014       Wellness Office Visit  Subjective:  Patient ID: Barry Wood, male    DOB: 1970/10/30  Age: 50 y.o. MRN: [id number]  CC: This man comes in for follow-up of vitamin D deficiency, erectile dysfunction and prediabetes.The appointment was scheduled for an annual physical exam but his insurance is unlikely to pay for this. HPI  He continues on vitamin D3 10,000 units daily for vitamin D deficiency.  He continues on Cialis for his erectile dysfunction . Previously he was found to be prediabetic but this has not been followed up. Past Medical History:  Diagnosis Date  . Erectile dysfunction   . Gonorrhea    Past Surgical History:  Procedure Laterality Date  . CYST EXCISION       Family History  Problem Relation Age of Onset  . Heart disease Mother   . Cancer Mother        Kidney  . 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   Social History   Tobacco Use  . Smoking status: Never Smoker  . Smokeless tobacco: Never Used  Substance Use Topics  . Alcohol use: Yes    Alcohol/week: 4.0 standard drinks    Types: 4 Shots of liquor per week    Comment: Occasional    Current Meds  Medication Sig  . Cholecalciferol (VITAMIN D-3) 125 MCG (5000 UT) TABS Take 2 tablets by mouth daily.  . MULTIPLE VITAMIN PO Take 1 capsule by mouth daily.  . tadalafil (CIALIS) 20 MG tablet Take 1 tablet (20 mg total) by mouth every other day as needed for erectile dysfunction.     Flowsheet Row Office Visit from 08/25/2020 in [facility]  PHQ-9 Total Score 0      Objective:   Today's Vitals: BP  122/78   Pulse 66   Temp 97.9 F (36.6 C) (Temporal)   Ht 5\' 8"  (1.727 m)   Wt 190 lb 6.4 oz (86.4 kg)   SpO2 98%   BMI 28.95 kg/m  Vitals with BMI 08/25/2020 12/17/2019 08/12/2019  Height 5\' 8"  5\' 9"  5\' 9"   Weight 190 lbs 6 oz 189 lbs 3 oz 198 lbs  BMI 28.96 27.93 29.23  Systolic 122 120 08/14/2019  Diastolic 78 80 80  Pulse 66 65 85     Physical Exam  Eric systemically well, and somewhat overweight.  Blood pressure in a good range.     Assessment   1. Colon cancer screening   2. Prediabetes   3. Vitamin D deficiency disease   4. Malaise and fatigue   5. Special screening for malignant neoplasm of prostate   6. Encounter for hepatitis C screening test for low risk patient   7. Erectile dysfunction, unspecified erectile dysfunction type       Tests ordered Orders Placed This Encounter  Procedures  . CBC  . COMPLETE METABOLIC PANEL WITH GFR  . Hemoglobin A1c  . Lipid panel  . PSA, Total with Reflex to PSA, Free  . TSH  . VITAMIN D 25 Hydroxy (Vit-D Deficiency, Fractures)  . Hepatitis C antibody  . Ambulatory referral to  Gastroenterology     Plan: 1. He will continue with vitamin D3 10,000 units daily and we will check levels today. 2. Other blood work is ordered. 3. I will refer him to gastroenterology for a screening colonoscopy which he is due for at his age.   No orders of the defined types were placed in this encounter.   Wilson Singer, MD

## 2020-08-26 LAB — CBC
HCT: 38.7 % (ref 38.5–50.0)
Hemoglobin: 13.2 g/dL (ref 13.2–17.1)
MCH: 32.7 pg (ref 27.0–33.0)
MCHC: 34.1 g/dL (ref 32.0–36.0)
MCV: 95.8 fL (ref 80.0–100.0)
MPV: 9.5 fL (ref 7.5–12.5)
Platelets: 266 10*3/uL (ref 140–400)
RBC: 4.04 10*6/uL — ABNORMAL LOW (ref 4.20–5.80)
RDW: 12.5 % (ref 11.0–15.0)
WBC: 4.8 10*3/uL (ref 3.8–10.8)

## 2020-08-26 LAB — COMPLETE METABOLIC PANEL WITH GFR
AG Ratio: 1.8 (calc) (ref 1.0–2.5)
ALT: 22 U/L (ref 9–46)
AST: 19 U/L (ref 10–35)
Albumin: 4.6 g/dL (ref 3.6–5.1)
Alkaline phosphatase (APISO): 66 U/L (ref 35–144)
BUN: 18 mg/dL (ref 7–25)
CO2: 26 mmol/L (ref 20–32)
Calcium: 9.4 mg/dL (ref 8.6–10.3)
Chloride: 104 mmol/L (ref 98–110)
Creat: 1.22 mg/dL (ref 0.70–1.33)
GFR, Est African American: 79 mL/min/{1.73_m2} (ref >=60)
GFR, Est Non African American: 68 mL/min/{1.73_m2} (ref >=60)
Globulin: 2.5 g/dL (calc) (ref 1.9–3.7)
Glucose, Bld: 94 mg/dL (ref 65–99)
Potassium: 4.3 mmol/L (ref 3.5–5.3)
Sodium: 137 mmol/L (ref 135–146)
Total Bilirubin: 0.3 mg/dL (ref 0.2–1.2)
Total Protein: 7.1 g/dL (ref 6.1–8.1)

## 2020-08-26 LAB — LIPID PANEL
Cholesterol: 166 mg/dL (ref <200)
HDL: 43 mg/dL (ref >=40)
LDL Cholesterol (Calc): 100 mg/dL (calc) — ABNORMAL HIGH
Non-HDL Cholesterol (Calc): 123 mg/dL (calc) (ref <130)
Total CHOL/HDL Ratio: 3.9 (calc) (ref <5.0)
Triglycerides: 133 mg/dL (ref <150)

## 2020-08-26 LAB — HEMOGLOBIN A1C
Hgb A1c MFr Bld: 5.5 % of total Hgb (ref <5.7)
Mean Plasma Glucose: 111 mg/dL
eAG (mmol/L): 6.2 mmol/L

## 2020-08-26 LAB — TSH: TSH: 1.07 mIU/L (ref 0.40–4.50)

## 2020-08-26 LAB — VITAMIN D 25 HYDROXY (VIT D DEFICIENCY, FRACTURES): Vit D, 25-Hydroxy: 27 ng/mL — ABNORMAL LOW (ref 30–100)

## 2020-08-26 LAB — HEPATITIS C ANTIBODY
Hepatitis C Ab: NONREACTIVE
SIGNAL TO CUT-OFF: 0.06 (ref <1.00)

## 2020-08-26 LAB — PSA, TOTAL WITH REFLEX TO PSA, FREE: PSA, Total: 0.2 ng/mL (ref <=4.0)

## 2020-08-28 ENCOUNTER — Encounter: Payer: Self-pay | Admitting: Internal Medicine

## 2020-08-31 NOTE — Progress Notes (Signed)
Please call this patient and let him know that his Vit D levels are very low-start taking Vitamin D3 10,000 units/day. Rest of the bloodwork is okay.

## 2020-11-03 ENCOUNTER — Encounter: Payer: Self-pay | Admitting: Internal Medicine

## 2020-11-03 ENCOUNTER — Ambulatory Visit: Payer: Medicaid Other | Admitting: Gastroenterology

## 2020-11-03 ENCOUNTER — Encounter: Payer: Self-pay | Admitting: Gastroenterology

## 2020-11-03 NOTE — Progress Notes (Deleted)
Primary Care Physician:  Elenore Paddy, NP Primary Gastroenterologist:  Dr.   Bonnetta Barry chief complaint on file.   HPI:   Barry Wood is a 50 y.o. male presenting today at the request of Dr. Karilyn Cota for screening colonoscopy. Past Medical history includes erectile dysfunction, Vitamin D deficiency and Prediabetes.    Date of Last colonoscopy: Never Date of Last endoscopy: Never  Personal GI History: Family GI History:  Past Medical History:  Diagnosis Date   Erectile dysfunction    Gonorrhea     Past Surgical History:  Procedure Laterality Date   CYST EXCISION      Current Outpatient Medications  Medication Sig Dispense Refill   Cholecalciferol (VITAMIN D-3) 125 MCG (5000 UT) TABS Take 2 tablets by mouth daily.     MULTIPLE VITAMIN PO Take 1 capsule by mouth daily.     tadalafil (CIALIS) 20 MG tablet Take 1 tablet (20 mg total) by mouth every other day as needed for erectile dysfunction. 60 tablet 3   No current facility-administered medications for this visit.    Allergies as of 11/03/2020 - Review Complete 08/25/2020  Allergen Reaction Noted   Diazepam Other (See Comments) 03/03/2011    Family History  Problem Relation Age of Onset   Heart disease Mother    Cancer Mother        Kidney   Heart disease Father     Social History   Socioeconomic History   Marital status: Married    Spouse name: Not on file   Number of children: Not on file   Years of education: Not on file   Highest education level: Not on file  Occupational History   Not on file  Tobacco Use   Smoking status: Never   Smokeless tobacco: Never  Vaping Use   Vaping Use: Never used  Substance and Sexual Activity   Alcohol use: Yes    Alcohol/week: 4.0 standard drinks    Types: 4 Shots of liquor per week    Comment: Occasional   Drug use: Yes    Types: Marijuana   Sexual activity: Yes    Birth control/protection: None  Other Topics Concern   Not on file  Social History  Narrative   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   Social Determinants of Health   Financial Resource Strain: Not on file  Food Insecurity: Not on file  Transportation Needs: Not on file  Physical Activity: Not on file  Stress: Not on file  Social Connections: Not on file  Intimate Partner Violence: Not on file    Review of Systems: Gen: Denies any fever, chills, fatigue, weight loss, lack of appetite.  CV: Denies chest pain, heart palpitations, peripheral edema, syncope.  Resp: Denies shortness of breath at rest or with exertion. Denies wheezing or cough.  GI: Denies dysphagia or odynophagia. Denies jaundice, hematemesis, fecal incontinence. GU : Denies urinary burning, urinary frequency, urinary hesitancy MS: Denies joint pain, muscle weakness, cramps, or limitation of movement.  Derm: Denies rash, itching, dry skin Psych: Denies depression, anxiety, memory loss, and confusion Heme: Denies bruising, bleeding, and enlarged lymph nodes.  Physical Exam: There were no vitals taken for this visit. General:   Alert and oriented. Pleasant and cooperative. Well-nourished and well-developed.  Head:  Normocephalic and atraumatic. Eyes:  Without icterus, sclera clear and conjunctiva pink.  Ears:  Normal auditory acuity. Mouth:  No deformity or lesions, oral mucosa pink.  Lungs:  Clear to auscultation  bilaterally. No wheezes, rales, or rhonchi. No distress.  Heart:  S1, S2 present without murmurs appreciated.  Abdomen:  +BS, soft, non-tender and non-distended. No HSM noted. No guarding or rebound. No masses appreciated.  Rectal:  Deferred  Msk:  Symmetrical without gross deformities. Normal posture. Extremities:  Without edema. Neurologic:  Alert and  oriented x4;  grossly normal neurologically. Skin:  Intact without significant lesions or rashes. Psych:  Alert and cooperative. Normal mood and affect.  ASSESSMENT: Barry Wood is a 50 y.o. male  presenting today to establish GI care for initial screening colonoscopy.    PLAN:     Risks and benefits of procedure discussed with patient. Patient informed of risk of bleeding, infection, injury on the inside and reaction to anesthesia. Patient verbalized understanding of these, agreeable to proceed with procedure.

## 2021-01-27 ENCOUNTER — Ambulatory Visit (INDEPENDENT_AMBULATORY_CARE_PROVIDER_SITE_OTHER): Payer: Medicaid Other | Admitting: Nurse Practitioner

## 2021-06-24 ENCOUNTER — Ambulatory Visit: Payer: Medicaid Other | Admitting: Nurse Practitioner

## 2021-06-24 ENCOUNTER — Other Ambulatory Visit: Payer: Self-pay

## 2021-06-24 ENCOUNTER — Ambulatory Visit (INDEPENDENT_AMBULATORY_CARE_PROVIDER_SITE_OTHER): Payer: Medicaid Other

## 2021-06-24 ENCOUNTER — Encounter: Payer: Self-pay | Admitting: Nurse Practitioner

## 2021-06-24 VITALS — BP 120/78 | HR 77 | Temp 98.5°F | Ht 69.0 in | Wt 192.2 lb

## 2021-06-24 DIAGNOSIS — R0683 Snoring: Secondary | ICD-10-CM

## 2021-06-24 DIAGNOSIS — M25552 Pain in left hip: Secondary | ICD-10-CM

## 2021-06-24 DIAGNOSIS — M5412 Radiculopathy, cervical region: Secondary | ICD-10-CM

## 2021-06-24 DIAGNOSIS — Z1211 Encounter for screening for malignant neoplasm of colon: Secondary | ICD-10-CM

## 2021-06-24 DIAGNOSIS — N529 Male erectile dysfunction, unspecified: Secondary | ICD-10-CM

## 2021-06-24 DIAGNOSIS — M4722 Other spondylosis with radiculopathy, cervical region: Secondary | ICD-10-CM | POA: Diagnosis not present

## 2021-06-24 DIAGNOSIS — M545 Low back pain, unspecified: Secondary | ICD-10-CM | POA: Diagnosis not present

## 2021-06-24 LAB — BASIC METABOLIC PANEL
BUN: 17 mg/dL (ref 6–23)
CO2: 31 mEq/L (ref 19–32)
Calcium: 9.2 mg/dL (ref 8.4–10.5)
Chloride: 104 mEq/L (ref 96–112)
Creatinine, Ser: 1.17 mg/dL (ref 0.40–1.50)
GFR: 72.01 mL/min (ref >60.00)
Glucose, Bld: 71 mg/dL (ref 70–99)
Potassium: 4.1 mEq/L (ref 3.5–5.1)
Sodium: 141 mEq/L (ref 135–145)

## 2021-06-24 MED ORDER — TADALAFIL 20 MG PO TABS: 20.0000 mg | ORAL_TABLET | ORAL | 3 refills | Status: DC | PRN

## 2021-06-24 MED ORDER — CYCLOBENZAPRINE HCL 5 MG PO TABS: 5.0000 mg | ORAL_TABLET | Freq: Three times a day (TID) | ORAL | 0 refills | Status: DC | PRN
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
## 2021-07-01 ENCOUNTER — Other Ambulatory Visit: Payer: Self-pay | Admitting: Nurse Practitioner

## 2021-07-01 DIAGNOSIS — M25552 Pain in left hip: Secondary | ICD-10-CM

## 2021-07-01 DIAGNOSIS — M5412 Radiculopathy, cervical region: Secondary | ICD-10-CM

## 2021-07-01 NOTE — Addendum Note (Signed)
Addended by: Jiles Prows E on: 07/01/2021 01:07 PM   Modules accepted: Orders

## 2021-07-06 DIAGNOSIS — G5601 Carpal tunnel syndrome, right upper limb: Secondary | ICD-10-CM | POA: Diagnosis not present

## 2021-07-06 DIAGNOSIS — M5412 Radiculopathy, cervical region: Secondary | ICD-10-CM | POA: Diagnosis not present

## 2021-07-06 DIAGNOSIS — Z6827 Body mass index (BMI) 27.0-27.9, adult: Secondary | ICD-10-CM | POA: Diagnosis not present

## 2021-07-06 DIAGNOSIS — M5442 Lumbago with sciatica, left side: Secondary | ICD-10-CM | POA: Diagnosis not present

## 2021-07-12 DIAGNOSIS — Z1211 Encounter for screening for malignant neoplasm of colon: Secondary | ICD-10-CM | POA: Diagnosis not present

## 2021-07-21 LAB — COLOGUARD: COLOGUARD: NEGATIVE

## 2021-07-23 DIAGNOSIS — G5603 Carpal tunnel syndrome, bilateral upper limbs: Secondary | ICD-10-CM | POA: Diagnosis not present

## 2021-09-23 ENCOUNTER — Encounter: Payer: Medicaid Other | Admitting: Nurse Practitioner

## 2021-10-19 ENCOUNTER — Other Ambulatory Visit: Payer: Self-pay

## 2021-10-19 ENCOUNTER — Emergency Department (HOSPITAL_COMMUNITY): Payer: Medicaid Other

## 2021-10-19 ENCOUNTER — Emergency Department (HOSPITAL_COMMUNITY)
Admission: EM | Admit: 2021-10-19 | Discharge: 2021-10-20 | Disposition: A | Discharge status: Home / Self Care | Payer: Medicaid Other | Attending: Emergency Medicine | Admitting: Emergency Medicine

## 2021-10-19 ENCOUNTER — Encounter (HOSPITAL_COMMUNITY): Payer: Self-pay

## 2021-10-19 DIAGNOSIS — M542 Cervicalgia: Secondary | ICD-10-CM | POA: Diagnosis not present

## 2021-10-19 DIAGNOSIS — T50994A Poisoning by other drugs, medicaments and biological substances, undetermined, initial encounter: Secondary | ICD-10-CM | POA: Insufficient documentation

## 2021-10-19 DIAGNOSIS — R456 Violent behavior: Secondary | ICD-10-CM | POA: Insufficient documentation

## 2021-10-19 DIAGNOSIS — M25511 Pain in right shoulder: Secondary | ICD-10-CM | POA: Insufficient documentation

## 2021-10-19 DIAGNOSIS — R519 Headache, unspecified: Secondary | ICD-10-CM | POA: Insufficient documentation

## 2021-10-19 DIAGNOSIS — S0990XA Unspecified injury of head, initial encounter: Secondary | ICD-10-CM | POA: Diagnosis not present

## 2021-10-19 DIAGNOSIS — R451 Restlessness and agitation: Secondary | ICD-10-CM | POA: Diagnosis not present

## 2021-10-19 DIAGNOSIS — F19929 Other psychoactive substance use, unspecified with intoxication, unspecified: Secondary | ICD-10-CM

## 2021-10-19 DIAGNOSIS — M25512 Pain in left shoulder: Secondary | ICD-10-CM | POA: Insufficient documentation

## 2021-10-19 DIAGNOSIS — M79603 Pain in arm, unspecified: Secondary | ICD-10-CM | POA: Diagnosis not present

## 2021-10-19 DIAGNOSIS — M25519 Pain in unspecified shoulder: Secondary | ICD-10-CM | POA: Diagnosis not present

## 2021-10-19 DIAGNOSIS — R52 Pain, unspecified: Secondary | ICD-10-CM | POA: Diagnosis not present

## 2021-10-19 DIAGNOSIS — S199XXA Unspecified injury of neck, initial encounter: Secondary | ICD-10-CM | POA: Diagnosis not present

## 2021-10-19 MED ORDER — HALOPERIDOL LACTATE 5 MG/ML IJ SOLN
2.0000 mg | Freq: Once | INTRAMUSCULAR | Status: AC
  Administered 2021-10-19: 2 mg via INTRAMUSCULAR
  Filled 2021-10-19: qty 1

## 2021-10-19 MED ORDER — DIPHENHYDRAMINE HCL 50 MG/ML IJ SOLN
50.0000 mg | Freq: Once | INTRAMUSCULAR | Status: AC
  Administered 2021-10-19: 50 mg via INTRAMUSCULAR
  Filled 2021-10-19: qty 1

## 2021-10-19 MED ORDER — LORAZEPAM 2 MG/ML IJ SOLN
2.0000 mg | Freq: Once | INTRAMUSCULAR | Status: DC
Start: 2021-10-19 — End: 2021-10-20
  Filled 2021-10-19: qty 1
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
## 2021-10-19 NOTE — ED Notes (Signed)
Pt is loud, obscene, and making lewd gestures. Pt is not compliant with directions
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
## 2021-10-19 NOTE — ED Notes (Signed)
Per [employer], holding Ativan
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
## 2021-10-20 DIAGNOSIS — S199XXA Unspecified injury of neck, initial encounter: Secondary | ICD-10-CM | POA: Diagnosis not present

## 2021-10-20 DIAGNOSIS — S0990XA Unspecified injury of head, initial encounter: Secondary | ICD-10-CM | POA: Diagnosis not present
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
## 2021-10-20 NOTE — ED Notes (Signed)
Approached pt to see if this RN could take VS. Pt refused at this time

## 2021-10-20 NOTE — ED Notes (Signed)
Patient transported to CT 

## 2021-10-20 NOTE — ED Notes (Signed)
ED Provider at bedside. 

## 2021-10-21 ENCOUNTER — Telehealth: Payer: Self-pay

## 2021-10-21 NOTE — Telephone Encounter (Signed)
Transition Care Management Unsuccessful Follow-up Telephone Call  Date of discharge and from where:  10/20/2021-[place]   Attempts:  1st Attempt  Reason for unsuccessful TCM follow-up call:  Unable to reach patient    

## 2021-10-22 ENCOUNTER — Ambulatory Visit
Admission: EM | Admit: 2021-10-22 | Discharge: 2021-10-22 | Disposition: A | Discharge status: Home / Self Care | Payer: Medicaid Other | Attending: Nurse Practitioner | Admitting: Nurse Practitioner

## 2021-10-22 ENCOUNTER — Ambulatory Visit: Payer: Self-pay

## 2021-10-22 ENCOUNTER — Ambulatory Visit (INDEPENDENT_AMBULATORY_CARE_PROVIDER_SITE_OTHER): Payer: Medicaid Other

## 2021-10-22 DIAGNOSIS — M25511 Pain in right shoulder: Secondary | ICD-10-CM

## 2021-10-22 DIAGNOSIS — M25552 Pain in left hip: Secondary | ICD-10-CM | POA: Diagnosis not present

## 2021-10-22 DIAGNOSIS — R0789 Other chest pain: Secondary | ICD-10-CM | POA: Diagnosis not present

## 2021-10-22 DIAGNOSIS — M5412 Radiculopathy, cervical region: Secondary | ICD-10-CM | POA: Diagnosis not present

## 2021-10-22 DIAGNOSIS — R079 Chest pain, unspecified: Secondary | ICD-10-CM | POA: Diagnosis not present

## 2021-10-22 MED ORDER — NAPROXEN 375 MG PO TABS: 375.0000 mg | ORAL_TABLET | Freq: Two times a day (BID) | ORAL | 0 refills | Status: DC | PRN

## 2021-10-22 MED ORDER — CYCLOBENZAPRINE HCL 5 MG PO TABS: 5.0000 mg | ORAL_TABLET | Freq: Three times a day (TID) | ORAL | 0 refills | Status: DC | PRN

## 2021-10-22 MED ORDER — KETOROLAC TROMETHAMINE 30 MG/ML IJ SOLN
30.0000 mg | Freq: Once | INTRAMUSCULAR | Status: AC
  Administered 2021-10-22: 30 mg via INTRAMUSCULAR
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
## 2021-10-22 NOTE — Telephone Encounter (Signed)
Transition Care Management Follow-up Telephone Call Date of discharge and from where: 10/20/2021-[place]  How have you been since you were released from the hospital? Pt stated he is going to urgent care because he is still in pain. He thinks his arm may be dislocated.  Any questions or concerns? No  Items Reviewed: Did the pt receive and understand the discharge instructions provided? Yes  Medications obtained and verified?  No medication given at discharge Other? No  Any new allergies since your discharge? No  Dietary orders reviewed? No Do you have support at home? Yes   Home Care and Equipment/Supplies: Were home health services ordered? not applicable If so, what is the name of the agency? N/A  Has the agency set up a time to come to the patient's home? not applicable Were any new equipment or medical supplies ordered?  No What is the name of the medical supply agency? N/A Were you able to get the supplies/equipment? not applicable Do you have any questions related to the use of the equipment or supplies? No  Functional Questionnaire: (I = Independent and D = Dependent) ADLs: I  Bathing/Dressing- I  Meal Prep- I  Eating- I  Maintaining continence- I  Transferring/Ambulation- I  Managing Meds- I  Follow up appointments reviewed:  PCP Hospital f/u appt confirmed? No   Specialist Hospital f/u appt confirmed? No  Are transportation arrangements needed? No  If their condition worsens, is the pt aware to call PCP or go to the Emergency Dept.? Yes Was the patient provided with contact information for the PCP's office or ED? Yes Was to pt encouraged to call back with questions or concerns? Yes
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
## 2021-10-22 NOTE — Discharge Instructions (Addendum)
-   The x-rays today do not show any acute fracture of your bones -  I suspect the pain you are having is muscular.  Please use the muscle relaxant at night time as needed for pain.  You can use the Naproxen twice daily for inflammation.  You can also use heat/ice for pain. - Please follow up with your dentist regarding the loose teeth. - Please seek care if your symptoms persist or worsen despite this treatment.

## 2021-10-26 ENCOUNTER — Encounter: Payer: Self-pay | Admitting: Nurse Practitioner

## 2021-10-26 ENCOUNTER — Ambulatory Visit: Payer: Medicaid Other | Admitting: Nurse Practitioner

## 2021-10-26 VITALS — BP 124/78 | HR 69 | Resp 18 | Ht 69.0 in | Wt 183.4 lb

## 2021-10-26 DIAGNOSIS — M25511 Pain in right shoulder: Secondary | ICD-10-CM

## 2021-10-26 DIAGNOSIS — Z789 Other specified health status: Secondary | ICD-10-CM | POA: Diagnosis not present

## 2021-10-26 DIAGNOSIS — R079 Chest pain, unspecified: Secondary | ICD-10-CM

## 2021-10-26 DIAGNOSIS — R222 Localized swelling, mass and lump, trunk: Secondary | ICD-10-CM

## 2021-10-26 LAB — COMPREHENSIVE METABOLIC PANEL
ALT: 21 U/L (ref 0–53)
AST: 21 U/L (ref 0–37)
Albumin: 4.4 g/dL (ref 3.5–5.2)
Alkaline Phosphatase: 66 U/L (ref 39–117)
BUN: 14 mg/dL (ref 6–23)
CO2: 26 mEq/L (ref 19–32)
Calcium: 9.2 mg/dL (ref 8.4–10.5)
Chloride: 107 mEq/L (ref 96–112)
Creatinine, Ser: 1 mg/dL (ref 0.40–1.50)
GFR: 86.73 mL/min (ref >60.00)
Glucose, Bld: 75 mg/dL (ref 70–99)
Potassium: 4.1 mEq/L (ref 3.5–5.1)
Sodium: 140 mEq/L (ref 135–145)
Total Bilirubin: 0.2 mg/dL (ref 0.2–1.2)
Total Protein: 7.3 g/dL (ref 6.0–8.3)

## 2021-10-26 LAB — FOLATE: Folate: 8.2 ng/mL (ref >5.9)
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
## 2021-10-31 LAB — VITAMIN B1: Vitamin B1 (Thiamine): 7 nmol/L — ABNORMAL LOW (ref 8–30)
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
## 2021-11-03 ENCOUNTER — Ambulatory Visit: Payer: Self-pay

## 2021-11-03 ENCOUNTER — Ambulatory Visit
Admission: RE | Admit: 2021-11-03 | Discharge: 2021-11-03 | Disposition: A | Discharge status: Home / Self Care | Payer: Medicaid Other | Source: Ambulatory Visit | Attending: Nurse Practitioner | Admitting: Nurse Practitioner

## 2021-11-03 ENCOUNTER — Encounter: Payer: Self-pay | Admitting: Internal Medicine

## 2021-11-03 ENCOUNTER — Ambulatory Visit (INDEPENDENT_AMBULATORY_CARE_PROVIDER_SITE_OTHER): Payer: Medicaid Other

## 2021-11-03 ENCOUNTER — Ambulatory Visit: Payer: Medicaid Other | Admitting: Internal Medicine

## 2021-11-03 VITALS — BP 118/82 | HR 56 | Ht 69.0 in | Wt 179.8 lb

## 2021-11-03 DIAGNOSIS — R9431 Abnormal electrocardiogram [ECG] [EKG]: Secondary | ICD-10-CM

## 2021-11-03 DIAGNOSIS — R222 Localized swelling, mass and lump, trunk: Secondary | ICD-10-CM | POA: Diagnosis not present

## 2021-11-03 DIAGNOSIS — R079 Chest pain, unspecified: Secondary | ICD-10-CM

## 2021-11-03 NOTE — Patient Instructions (Signed)
Medication Instructions:  No Changes In Medications at this time.  *If you need a refill on your cardiac medications before your next appointment, please call your pharmacy*  Lab Work: None Ordered At This Time.  If you have labs (blood work) drawn today and your tests are completely normal, you will receive your results only by: [place] (if you have MyChart) OR A paper copy in the mail If you have any lab test that is abnormal or we need to change your treatment, we will call you to review the results.  Testing/Procedures: Your physician has requested that you have an echocardiogram. Echocardiography is a painless test that uses sound waves to create images of your heart. It provides your doctor with information about the size and shape of your heart and how well your heart's chambers and valves are working. You may receive an ultrasound enhancing agent through an IV if needed to better visualize your heart during the echo.This procedure takes approximately one hour. There are no restrictions for this procedure. This will take place at the [address].    ZIO XT- Long Term Monitor Instructions   Your physician has requested you wear your ZIO patch monitor___14____days.   This is a single patch monitor.  Irhythm supplies one patch monitor per enrollment.  Additional stickers are not available.   Please do not apply patch if you will be having a Nuclear Stress Test, Echocardiogram, Cardiac CT, MRI, or Chest Xray during the time frame you would be wearing the monitor. The patch cannot be worn during these tests.  You cannot remove and re-apply the ZIO XT patch monitor.   Your ZIO patch monitor will be sent USPS Priority mail from [facility] directly to your home address. The monitor may also be mailed to a PO BOX if home delivery is not available.   It may take 3-5 days to receive your monitor after you have been enrolled.   Once you have received you  monitor, please review enclosed instructions.  Your monitor has already been registered assigning a specific monitor serial # to you.   Applying the monitor   Shave hair from upper left chest.   Hold abrader disc by orange tab.  Rub abrader in 40 strokes over left upper chest as indicated in your monitor instructions.   Clean area with 4 enclosed alcohol pads .  Use all pads to assure are is cleaned thoroughly.  Let dry.   Apply patch as indicated in monitor instructions.  Patch will be place under collarbone on left side of chest with arrow pointing upward.   Rub patch adhesive wings for 2 minutes.Remove white label marked "1".  Remove white label marked "2".  Rub patch adhesive wings for 2 additional minutes.   While looking in a mirror, press and release button in center of patch.  A small green light will flash 3-4 times .  This will be your only indicator the monitor has been turned on.     Do not shower for the first 24 hours.  You may shower after the first 24 hours.   Press button if you feel a symptom. You will hear a small click.  Record Date, Time and Symptom in the Patient Log Book.   When you are ready to remove patch, follow instructions on last 2 pages of Patient Log Book.  Stick patch monitor onto last page of Patient Log Book.   Place Patient Log Book in [facility]  box.  Use locking tab on box and tape box closed securely.  The [employer] has [employer] on it.  Please place in mailbox as soon as possible.  Your physician should have your test results approximately 7 days after the monitor has been mailed back to [facility].   Call [facility] Customer Care at [phone] if you have questions regarding your ZIO XT patch monitor.  Call them immediately if you see an orange light blinking on your monitor.   If your monitor falls off in less than 4 days contact our Monitor department at [phone].  If your monitor becomes loose or falls off after 4 days  call [employer] at [phone] for suggestions on securing your monitor.    Follow-Up: At [facility], you and your health needs are our priority.  As part of our continuing mission to provide you with exceptional heart care, we have created designated Provider Care Teams.  These Care Teams include your primary Cardiologist (physician) and Advanced Practice Providers (APPs -  Physician Assistants and Nurse Practitioners) who all work together to provide you with the care you need, when you need it.  We recommend signing up for the patient portal called "MyChart".  Sign up information is provided on this After Visit Summary.  MyChart is used to connect with patients for Virtual Visits (Telemedicine).  Patients are able to view lab/test results, encounter notes, upcoming appointments, etc.  Non-urgent messages can be sent to your provider as well.   To learn more about what you can do with MyChart, go to [email or web address].    Your next appointment:   6 month(s)  The format for your next appointment:   In Person  Provider:   Maisie Fus, MD

## 2021-11-03 NOTE — Progress Notes (Unsigned)
Enrolled patient for a 14 day Zio XT  monitor to be mailed to patients home  °

## 2021-11-03 NOTE — Telephone Encounter (Signed)
Pt called, unable to leave VM.

## 2021-11-03 NOTE — Telephone Encounter (Signed)
Spoke with pt's wife. 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 I advised her I will try to call his number back and if unable to reach him to have him call back tomorrow to fu with his questions. She verbalized understanding.

## 2021-11-03 NOTE — Telephone Encounter (Signed)
Pt called, no VM available. Line just keeps ringing.   Summary: Questions about his AVS   Pt has questions about his AVS, please advise

## 2021-11-05 ENCOUNTER — Telehealth: Payer: Self-pay

## 2021-11-05 ENCOUNTER — Other Ambulatory Visit: Payer: Self-pay | Admitting: Nurse Practitioner

## 2021-11-05 DIAGNOSIS — E519 Thiamine deficiency, unspecified: Secondary | ICD-10-CM

## 2021-11-05 MED ORDER — THIAMINE HCL 100 MG PO TABS: 100.0000 mg | ORAL_TABLET | Freq: Every day | ORAL | 3 refills | Status: AC

## 2021-11-05 NOTE — Telephone Encounter (Signed)
Pt.called back for results,advised pt Barry Wood result notesPlease call this patient and let him know that his thiamine level also known as vitamin B1 was low. I recommend he look for a thiamine supplement. He should take 100 mg by mouth daily and he should follow-up with me as scheduled so we can recheck the serum levels.   I advised pt new Rx was called into [facility].  FYI

## 2021-11-05 NOTE — Telephone Encounter (Addendum)
Patient voicemail isnt working  Please call this patient and let him know that his thiamine level also known as vitamin B1 was low. I recommend he look for a thiamine supplement. He should take 100 mg by mouth daily and he should follow-up with me as scheduled so we can recheck the serum levels.

## 2021-11-12 DIAGNOSIS — R9431 Abnormal electrocardiogram [ECG] [EKG]: Secondary | ICD-10-CM | POA: Diagnosis not present

## 2021-11-24 ENCOUNTER — Ambulatory Visit (HOSPITAL_BASED_OUTPATIENT_CLINIC_OR_DEPARTMENT_OTHER): Payer: Medicaid Other

## 2021-11-24 DIAGNOSIS — M25511 Pain in right shoulder: Secondary | ICD-10-CM | POA: Diagnosis not present

## 2021-11-24 DIAGNOSIS — R7303 Prediabetes: Secondary | ICD-10-CM | POA: Diagnosis not present

## 2021-11-24 DIAGNOSIS — R9431 Abnormal electrocardiogram [ECG] [EKG]: Secondary | ICD-10-CM

## 2021-11-24 LAB — ECHOCARDIOGRAM COMPLETE
Area-P 1/2: 4.39 cm2
S' Lateral: 3.1 cm

## 2021-11-25 ENCOUNTER — Encounter (HOSPITAL_COMMUNITY): Payer: Self-pay | Admitting: Physical Therapy

## 2021-11-25 ENCOUNTER — Ambulatory Visit (HOSPITAL_COMMUNITY): Payer: Medicaid Other | Attending: Nurse Practitioner | Admitting: Physical Therapy

## 2021-11-25 DIAGNOSIS — R9431 Abnormal electrocardiogram [ECG] [EKG]: Secondary | ICD-10-CM | POA: Insufficient documentation

## 2021-11-25 DIAGNOSIS — M25511 Pain in right shoulder: Secondary | ICD-10-CM | POA: Insufficient documentation

## 2021-11-25 DIAGNOSIS — R7303 Prediabetes: Secondary | ICD-10-CM | POA: Insufficient documentation
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
## 2021-11-29 ENCOUNTER — Encounter: Payer: Self-pay | Admitting: *Deleted

## 2021-11-30 ENCOUNTER — Encounter (HOSPITAL_COMMUNITY): Payer: Self-pay

## 2021-11-30 ENCOUNTER — Ambulatory Visit (HOSPITAL_COMMUNITY): Payer: Medicaid Other | Attending: Nurse Practitioner

## 2021-11-30 DIAGNOSIS — M25511 Pain in right shoulder: Secondary | ICD-10-CM | POA: Insufficient documentation
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
## 2021-12-03 ENCOUNTER — Ambulatory Visit: Payer: Medicaid Other | Admitting: Nurse Practitioner

## 2021-12-07 ENCOUNTER — Encounter (HOSPITAL_COMMUNITY): Payer: Medicaid Other | Admitting: Physical Therapy

## 2021-12-08 ENCOUNTER — Encounter (HOSPITAL_COMMUNITY): Payer: Medicaid Other | Admitting: Physical Therapy

## 2021-12-14 ENCOUNTER — Ambulatory Visit (HOSPITAL_COMMUNITY): Payer: Medicaid Other | Admitting: Physical Therapy

## 2021-12-14 ENCOUNTER — Encounter (HOSPITAL_COMMUNITY): Payer: Self-pay | Admitting: Physical Therapy

## 2021-12-14 DIAGNOSIS — M25511 Pain in right shoulder: Secondary | ICD-10-CM
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
## 2021-12-21 ENCOUNTER — Ambulatory Visit (HOSPITAL_COMMUNITY): Payer: Medicaid Other | Attending: Nurse Practitioner | Admitting: Physical Therapy

## 2021-12-21 DIAGNOSIS — M25511 Pain in right shoulder: Secondary | ICD-10-CM | POA: Insufficient documentation
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
## 2021-12-23 ENCOUNTER — Ambulatory Visit (HOSPITAL_COMMUNITY): Payer: Medicaid Other | Admitting: Physical Therapy

## 2021-12-23 DIAGNOSIS — M25511 Pain in right shoulder: Secondary | ICD-10-CM | POA: Diagnosis not present
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
## 2021-12-24 ENCOUNTER — Ambulatory Visit: Payer: Medicaid Other | Admitting: Nurse Practitioner

## 2021-12-24 VITALS — BP 120/78 | HR 60 | Temp 98.2°F | Ht 69.0 in | Wt 186.0 lb

## 2021-12-24 DIAGNOSIS — R079 Chest pain, unspecified: Secondary | ICD-10-CM | POA: Diagnosis not present

## 2021-12-24 DIAGNOSIS — N529 Male erectile dysfunction, unspecified: Secondary | ICD-10-CM | POA: Diagnosis not present

## 2021-12-24 DIAGNOSIS — E041 Nontoxic single thyroid nodule: Secondary | ICD-10-CM

## 2021-12-24 MED ORDER — TADALAFIL 20 MG PO TABS: 20.0000 mg | ORAL_TABLET | ORAL | 3 refills | Status: DC | PRN

## 2021-12-24 NOTE — Assessment & Plan Note (Signed)
Chronic, patient requesting refill for tadalafil.  Refill approved and sent to patient's pharmacy.

## 2021-12-24 NOTE — Assessment & Plan Note (Signed)
Much improved, patient encouraged to follow-up with cardiology as scheduled.  No additional work-up recommended today.

## 2021-12-24 NOTE — Assessment & Plan Note (Signed)
Mass noted on patient's chest to think it is too low to be considered thyroid nodule, however we will order ultrasound of thyroid for further evaluation nodule noted on CT scan.  Further recommendations to be made based upon these results.
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
## 2021-12-29 ENCOUNTER — Ambulatory Visit (HOSPITAL_COMMUNITY): Payer: Medicaid Other | Admitting: Physical Therapy

## 2022-01-10 ENCOUNTER — Ambulatory Visit (HOSPITAL_COMMUNITY): Payer: Medicaid Other

## 2022-05-02 ENCOUNTER — Encounter: Payer: Self-pay | Admitting: Internal Medicine

## 2022-05-02 ENCOUNTER — Ambulatory Visit: Payer: Medicaid Other | Attending: Internal Medicine | Admitting: Internal Medicine

## 2022-05-02 VITALS — BP 122/72 | HR 64 | Ht 69.0 in | Wt 190.0 lb

## 2022-05-02 DIAGNOSIS — R002 Palpitations: Secondary | ICD-10-CM
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
## 2022-05-02 NOTE — Patient Instructions (Signed)

## 2022-06-24 ENCOUNTER — Ambulatory Visit: Payer: Medicaid Other | Admitting: Nurse Practitioner

## 2022-07-06 DIAGNOSIS — Z6827 Body mass index (BMI) 27.0-27.9, adult: Secondary | ICD-10-CM | POA: Diagnosis not present

## 2022-07-06 DIAGNOSIS — R030 Elevated blood-pressure reading, without diagnosis of hypertension: Secondary | ICD-10-CM | POA: Diagnosis not present

## 2022-07-06 DIAGNOSIS — J069 Acute upper respiratory infection, unspecified: Secondary | ICD-10-CM | POA: Diagnosis not present

## 2022-07-06 DIAGNOSIS — E663 Overweight: Secondary | ICD-10-CM | POA: Diagnosis not present

## 2022-07-15 ENCOUNTER — Ambulatory Visit: Payer: Medicaid Other | Admitting: Nurse Practitioner

## 2022-07-15 VITALS — BP 118/68 | HR 70 | Temp 98.1°F | Ht 69.0 in | Wt 187.2 lb

## 2022-07-15 DIAGNOSIS — N529 Male erectile dysfunction, unspecified: Secondary | ICD-10-CM

## 2022-07-15 DIAGNOSIS — R222 Localized swelling, mass and lump, trunk: Secondary | ICD-10-CM | POA: Diagnosis not present

## 2022-07-15 MED ORDER — SILDENAFIL CITRATE 50 MG PO TABS: 50.0000 mg | ORAL_TABLET | Freq: Every day | ORAL | 3 refills | Status: DC | PRN

## 2022-07-15 NOTE — Assessment & Plan Note (Signed)
Chronic, will prescribe sildenafil to see if this is more cost effective.  Patient educated to take 1 to 2 tablets by mouth once a day as needed for erectile dysfunction.  Patient reports understanding.

## 2022-07-15 NOTE — Progress Notes (Signed)
   Established Patient Office Visit  Subjective   Patient ID: Barry Wood, male    DOB: 01/16/1971  Age: 52 y.o. MRN: [id number]  Chief Complaint  Patient presents with   Mass    Mass to left side of chest still present.  No imaging thus far has been able to detect it.  Patient reports mass is painless, movable, has been stable in size.  Has been present for at least 8 months at this point.  Continues to have erectile dysfunction unable to fill Cialis due to financial barriers.    ROS: see HPI    Objective:     BP 118/68   Pulse 70   Temp 98.1 F (36.7 C) (Temporal)   Ht 5' 9"$  (1.753 m)   Wt 187 lb 4 oz (84.9 kg)   SpO2 100%   BMI 27.65 kg/m    Physical Exam Vitals reviewed.  Constitutional:      Appearance: Normal appearance.  HENT:     Head: Normocephalic and atraumatic.  Cardiovascular:     Rate and Rhythm: Normal rate and regular rhythm.  Pulmonary:     Effort: Pulmonary effort is normal.     Breath sounds: Normal breath sounds.  Musculoskeletal:     Cervical back: Neck supple.  Skin:    General: Skin is warm and dry.  Neurological:     Mental Status: He is alert and oriented to person, place, and time.  Psychiatric:        Mood and Affect: Mood normal.        Behavior: Behavior normal.        Thought Content: Thought content normal.        Judgment: Judgment normal.      No results found for any visits on 07/15/22.    The 10-year ASCVD risk score (Arnett DK, et al., 2019) is: 5%    Assessment & Plan:   Problem List Items Addressed This Visit       Other   Erectile dysfunction    Chronic, will prescribe sildenafil to see if this is more cost effective.  Patient educated to take 1 to 2 tablets by mouth once a day as needed for erectile dysfunction.  Patient reports understanding.      Relevant Medications   sildenafil (VIAGRA) 50 MG tablet   Chest wall mass - Primary    Etiology still unclear, reassurance provided to patient as  mass has been stable in size and is movable.  However per shared decision making we will refer to general surgery to determine if biopsy would be reasonable neck step.      Relevant Orders   Ambulatory referral to General Surgery    Return in about 3 months (around 10/13/2022) for 3-28month with Quita Mcgrory f/u.    SAilene Ards NP

## 2022-07-15 NOTE — Patient Instructions (Signed)
Thiamine (also known as B1) - take '100mg'$  by mouth daily

## 2022-07-15 NOTE — Assessment & Plan Note (Signed)
Etiology still unclear, reassurance provided to patient as mass has been stable in size and is movable.  However per shared decision making we will refer to general surgery to determine if biopsy would be reasonable neck step.

## 2022-08-04 ENCOUNTER — Other Ambulatory Visit: Payer: Self-pay | Admitting: Nurse Practitioner

## 2022-08-04 ENCOUNTER — Telehealth: Payer: Self-pay | Admitting: Nurse Practitioner

## 2022-08-04 DIAGNOSIS — N529 Male erectile dysfunction, unspecified: Secondary | ICD-10-CM

## 2022-08-04 NOTE — Telephone Encounter (Signed)
Patient called and said he would like to increase his dosage of sildenafil (VIAGRA) 50 MG tablet from 50 to 100. He said he spoke with Jeralyn Ruths about updating the dosage. He would like that updated dosage sent to [place], [place]. Barry Wood  Last appointment 07/15/22. Best callback is [phone].

## 2022-08-05 MED ORDER — SILDENAFIL CITRATE 100 MG PO TABS: 100.0000 mg | ORAL_TABLET | Freq: Every day | ORAL | 2 refills | Status: DC | PRN

## 2022-08-10 NOTE — Telephone Encounter (Signed)
Notified pt w/ [name] response. Pt states he actually pick rx up today.Marland KitchenJohny Wood

## 2022-09-29 ENCOUNTER — Telehealth: Payer: Self-pay | Admitting: Nurse Practitioner

## 2022-09-29 DIAGNOSIS — N529 Male erectile dysfunction, unspecified: Secondary | ICD-10-CM

## 2022-09-29 MED ORDER — SILDENAFIL CITRATE 100 MG PO TABS: 100.0000 mg | ORAL_TABLET | Freq: Every day | ORAL | 0 refills | Status: DC | PRN

## 2022-09-29 NOTE — Telephone Encounter (Signed)
Sent refill to [facility] 

## 2022-09-29 NOTE — Telephone Encounter (Signed)
Prescription Request  09/29/2022  LOV: 07/15/2022  What is the name of the medication or equipment? Generic viagra  Have you contacted your pharmacy to request a refill? Yes   Which pharmacy would you like this sent to?  [id number] - [place], [place] - [address] [place] [address] Phone: [phone] Fax: [phone]   Patient notified that their request is being sent to the clinical staff for review and that they should receive a response within 2 business days.   Please advise at Mobile [phone] (mobile)

## 2022-10-20 ENCOUNTER — Other Ambulatory Visit: Payer: Self-pay | Admitting: Nurse Practitioner

## 2022-10-20 DIAGNOSIS — N529 Male erectile dysfunction, unspecified: Secondary | ICD-10-CM

## 2022-11-04 ENCOUNTER — Other Ambulatory Visit: Payer: Self-pay | Admitting: Nurse Practitioner

## 2022-11-04 DIAGNOSIS — N529 Male erectile dysfunction, unspecified: Secondary | ICD-10-CM

## 2022-12-02 ENCOUNTER — Other Ambulatory Visit: Payer: Self-pay | Admitting: Nurse Practitioner

## 2022-12-02 DIAGNOSIS — N529 Male erectile dysfunction, unspecified: Secondary | ICD-10-CM

## 2022-12-16 ENCOUNTER — Other Ambulatory Visit: Payer: Self-pay | Admitting: Nurse Practitioner

## 2022-12-16 DIAGNOSIS — N529 Male erectile dysfunction, unspecified: Secondary | ICD-10-CM

## 2023-01-27 ENCOUNTER — Other Ambulatory Visit: Payer: Self-pay | Admitting: Nurse Practitioner

## 2023-01-27 ENCOUNTER — Ambulatory Visit: Payer: Medicaid Other | Admitting: Nurse Practitioner

## 2023-01-27 VITALS — BP 134/86 | HR 59 | Temp 98.6°F | Ht 69.0 in | Wt 187.0 lb

## 2023-01-27 DIAGNOSIS — N529 Male erectile dysfunction, unspecified: Secondary | ICD-10-CM

## 2023-01-27 DIAGNOSIS — E519 Thiamine deficiency, unspecified: Secondary | ICD-10-CM | POA: Diagnosis not present

## 2023-01-27 DIAGNOSIS — E559 Vitamin D deficiency, unspecified: Secondary | ICD-10-CM | POA: Insufficient documentation

## 2023-01-27 DIAGNOSIS — R222 Localized swelling, mass and lump, trunk: Secondary | ICD-10-CM

## 2023-01-27 DIAGNOSIS — E041 Nontoxic single thyroid nodule: Secondary | ICD-10-CM

## 2023-01-27 DIAGNOSIS — Z13220 Encounter for screening for lipoid disorders: Secondary | ICD-10-CM | POA: Diagnosis not present

## 2023-01-27 DIAGNOSIS — Z136 Encounter for screening for cardiovascular disorders: Secondary | ICD-10-CM

## 2023-01-27 LAB — CBC
HCT: 40.6 % (ref 39.0–52.0)
Hemoglobin: 13.3 g/dL (ref 13.0–17.0)
MCHC: 32.7 g/dL (ref 30.0–36.0)
MCV: 96.2 fl (ref 78.0–100.0)
Platelets: 281 10*3/uL (ref 150.0–400.0)
RBC: 4.22 Mil/uL (ref 4.22–5.81)
RDW: 12.7 % (ref 11.5–15.5)
WBC: 5.7 10*3/uL (ref 4.0–10.5)

## 2023-01-27 LAB — LIPID PANEL
Cholesterol: 176 mg/dL (ref 0–200)
HDL: 47.2 mg/dL (ref >39.00)
LDL Cholesterol: 113 mg/dL — ABNORMAL HIGH (ref 0–99)
NonHDL: 128.79
Total CHOL/HDL Ratio: 4
Triglycerides: 81 mg/dL (ref 0.0–149.0)
VLDL: 16.2 mg/dL (ref 0.0–40.0)

## 2023-01-27 LAB — VITAMIN D 25 HYDROXY (VIT D DEFICIENCY, FRACTURES): VITD: 42.07 ng/mL (ref 30.00–100.00)

## 2023-01-27 LAB — BASIC METABOLIC PANEL
BUN: 14 mg/dL (ref 6–23)
CO2: 27 meq/L (ref 19–32)
Calcium: 9.7 mg/dL (ref 8.4–10.5)
Chloride: 104 meq/L (ref 96–112)
Creatinine, Ser: 1.14 mg/dL (ref 0.40–1.50)
GFR: 73.46 mL/min (ref >60.00)
Glucose, Bld: 88 mg/dL (ref 70–99)
Potassium: 4 meq/L (ref 3.5–5.1)
Sodium: 139 meq/L (ref 135–145)

## 2023-01-27 LAB — TSH: TSH: 1.78 u[IU]/mL (ref 0.35–5.50)

## 2023-01-27 MED ORDER — SILDENAFIL CITRATE 100 MG PO TABS
100.0000 mg | ORAL_TABLET | Freq: Every day | ORAL | 2 refills | Status: DC | PRN
Start: 2023-01-27 — End: 2023-05-26
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
## 2023-01-27 NOTE — Assessment & Plan Note (Signed)
Labs ordered, further recommendations may be made based upon these results. 

## 2023-01-27 NOTE — Assessment & Plan Note (Signed)
Ultrasound of thyroid ordered today.  Further recommendations may be made based upon his results

## 2023-01-27 NOTE — Assessment & Plan Note (Signed)
Chronic, intermittent Check metabolic panel as long as kidney function remains stable will send in prescription for sildenafil 100 mg tablets that he can take as needed for erectile dysfunction

## 2023-01-27 NOTE — Assessment & Plan Note (Signed)
Etiology still unclear.  Chronic and stable in size Refer back to general surgery

## 2023-01-30 LAB — VITAMIN B1: Vitamin B1 (Thiamine): 7 nmol/L — ABNORMAL LOW (ref 8–30)

## 2023-02-06 ENCOUNTER — Other Ambulatory Visit: Payer: Medicaid Other

## 2023-02-11 IMAGING — DX DG CHEST 2V
2 series · 2 of 2 positions shown · non-contrast
Comparison: Chest x-ray dated April 28, 2009

CLINICAL DATA: Right shoulder pain

EXAM:
CHEST - 2 VIEW

[chest pa]
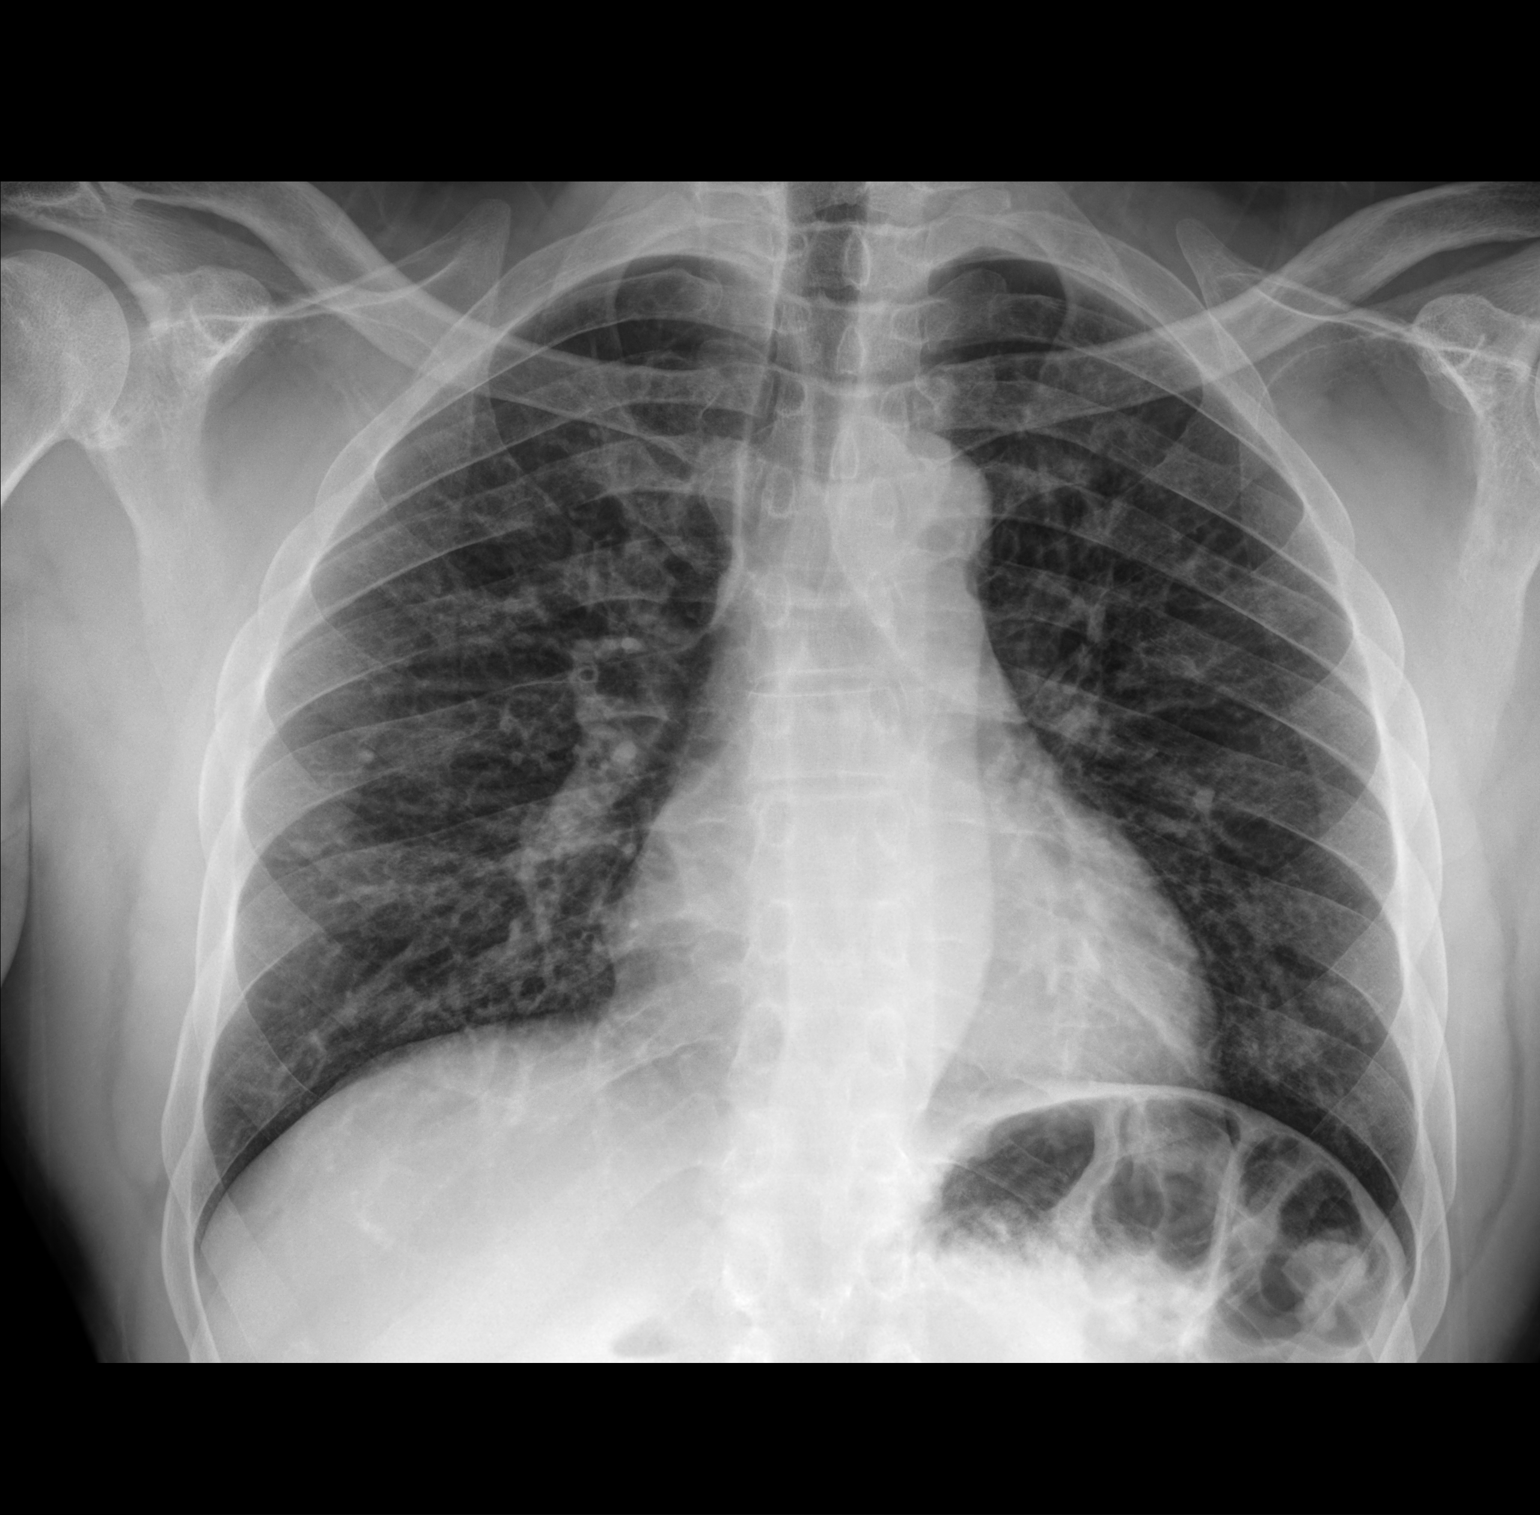

[chest lat]
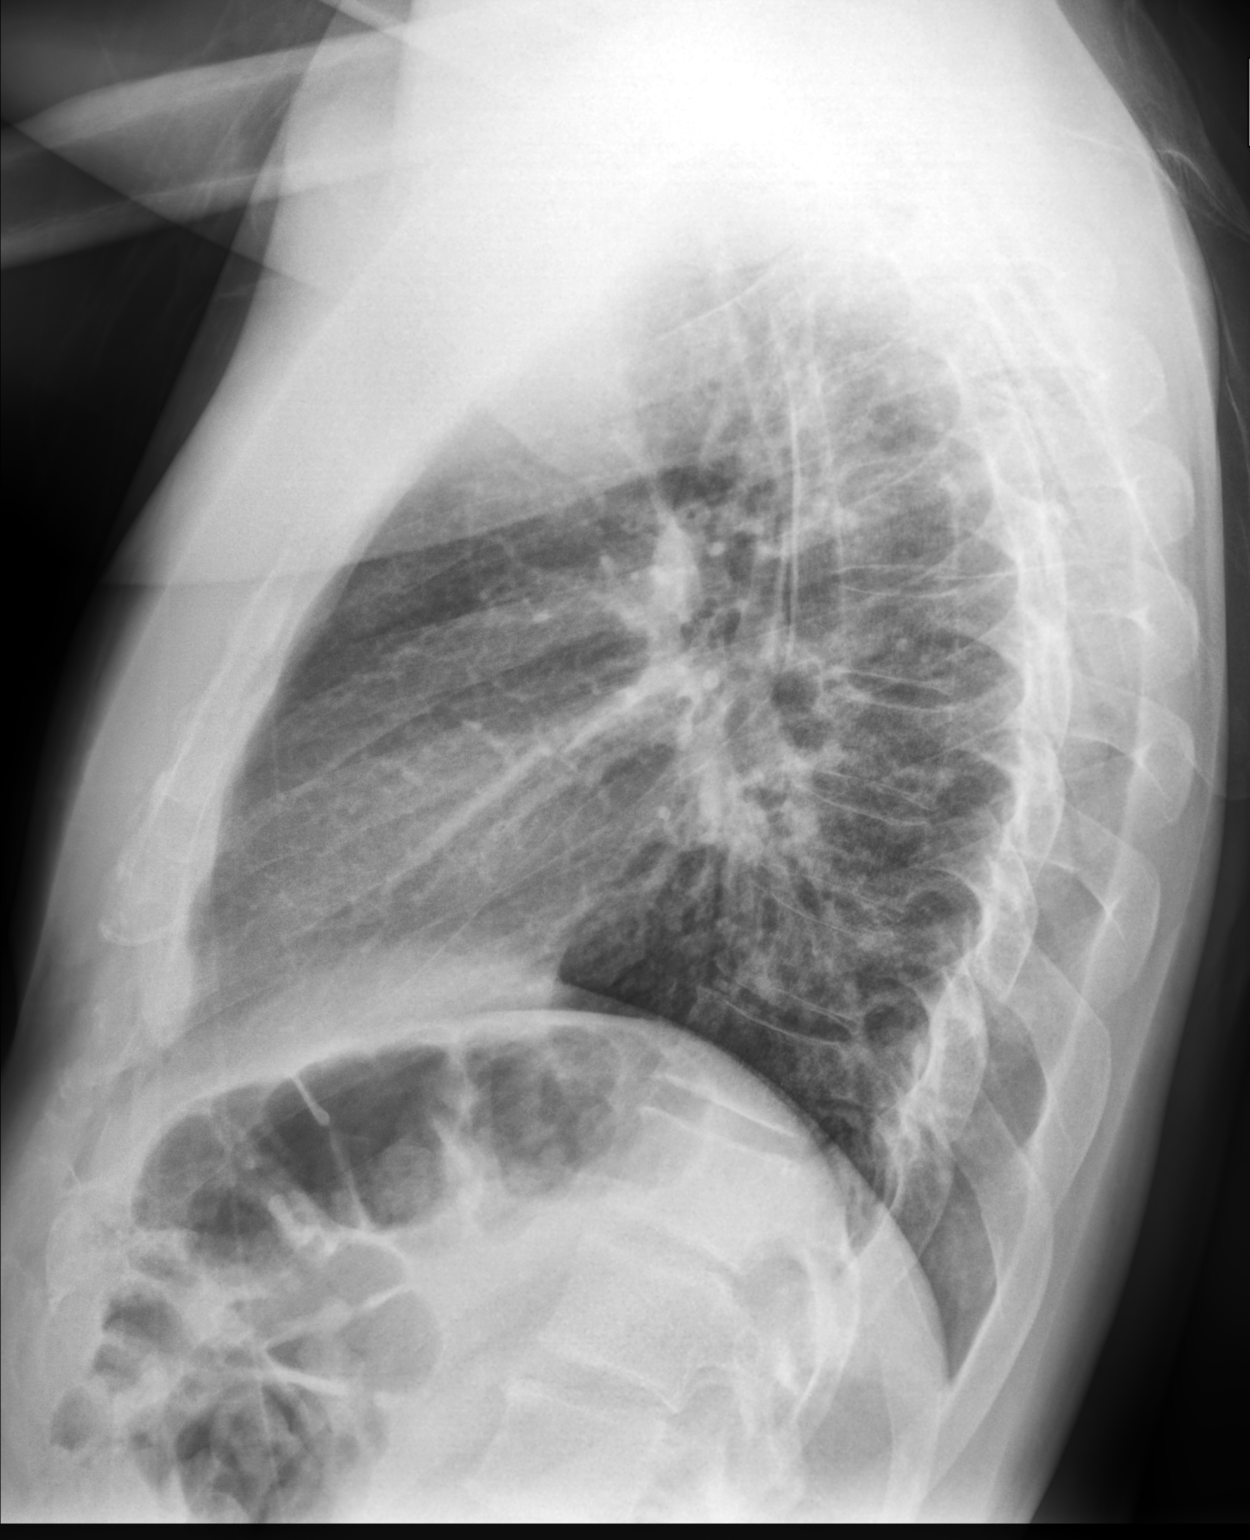

[2 of 2 positions shown; findings below may reference images not displayed]

FINDINGS: The heart size and mediastinal contours are within normal limits.
Both lungs are clear. The visualized skeletal structures are
unremarkable.
IMPRESSION: No active cardiopulmonary disease.
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
## 2023-02-14 ENCOUNTER — Ambulatory Visit
Admission: RE | Admit: 2023-02-14 | Discharge: 2023-02-14 | Disposition: A | Discharge status: Home / Self Care | Payer: Medicaid Other | Source: Ambulatory Visit | Attending: Nurse Practitioner | Admitting: Nurse Practitioner

## 2023-02-14 DIAGNOSIS — E041 Nontoxic single thyroid nodule: Secondary | ICD-10-CM

## 2023-02-23 ENCOUNTER — Other Ambulatory Visit: Payer: Self-pay

## 2023-02-23 ENCOUNTER — Encounter (HOSPITAL_COMMUNITY): Payer: Self-pay

## 2023-02-23 ENCOUNTER — Emergency Department (HOSPITAL_COMMUNITY)
Admission: EM | Admit: 2023-02-23 | Discharge: 2023-02-24 | Disposition: A | Discharge status: Home / Self Care | Payer: Medicaid Other | Attending: Emergency Medicine | Admitting: Emergency Medicine

## 2023-02-23 DIAGNOSIS — R21 Rash and other nonspecific skin eruption: Secondary | ICD-10-CM | POA: Diagnosis present

## 2023-02-23 LAB — COMPREHENSIVE METABOLIC PANEL
ALT: 21 U/L (ref 0–44)
AST: 24 U/L (ref 15–41)
Albumin: 3.8 g/dL (ref 3.5–5.0)
Alkaline Phosphatase: 62 U/L (ref 38–126)
Anion gap: 8 (ref 5–15)
BUN: 15 mg/dL (ref 6–20)
CO2: 23 mmol/L (ref 22–32)
Calcium: 8.8 mg/dL — ABNORMAL LOW (ref 8.9–10.3)
Chloride: 105 mmol/L (ref 98–111)
Creatinine, Ser: 1.11 mg/dL (ref 0.61–1.24)
GFR, Estimated: >60 mL/min (ref >60)
Glucose, Bld: 90 mg/dL (ref 70–99)
Potassium: 3.8 mmol/L (ref 3.5–5.1)
Sodium: 136 mmol/L (ref 135–145)
Total Bilirubin: 0.5 mg/dL (ref 0.3–1.2)
Total Protein: 7.2 g/dL (ref 6.5–8.1)

## 2023-02-23 LAB — CBC
HCT: 37.3 % — ABNORMAL LOW (ref 39.0–52.0)
Hemoglobin: 12.4 g/dL — ABNORMAL LOW (ref 13.0–17.0)
MCH: 32.3 pg (ref 26.0–34.0)
MCHC: 33.2 g/dL (ref 30.0–36.0)
MCV: 97.1 fL (ref 80.0–100.0)
Platelets: 274 10*3/uL (ref 150–400)
RBC: 3.84 MIL/uL — ABNORMAL LOW (ref 4.22–5.81)
RDW: 12.5 % (ref 11.5–15.5)
WBC: 7 10*3/uL (ref 4.0–10.5)
nRBC: 0 % (ref 0.0–0.2)

## 2023-02-23 IMAGING — US US SOFT TISSUE
1 series · 8 of 8 positions shown · non-contrast
Comparison: None Available.

CLINICAL DATA: Chest mass.

EXAM:
ULTRASOUND OF CHEST SOFT TISSUES
TECHNIQUE: Ultrasound examination of the chest wall soft tissues was performed
in the area of clinical concern.

[Series 1: us soft tissue · 0.08mm/px · 8 of 8 slices shown]
[im 1/8]
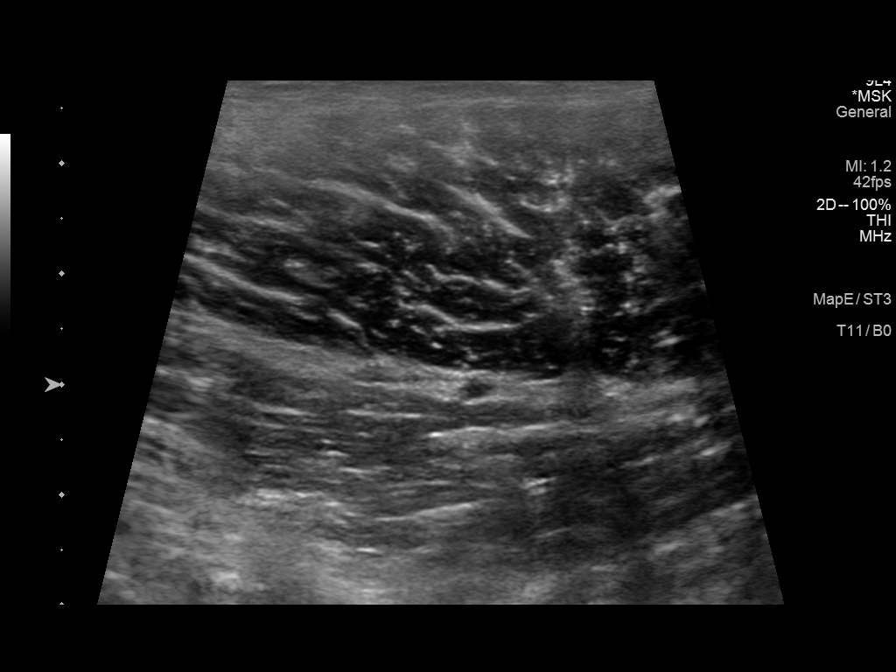
[im 2/8]
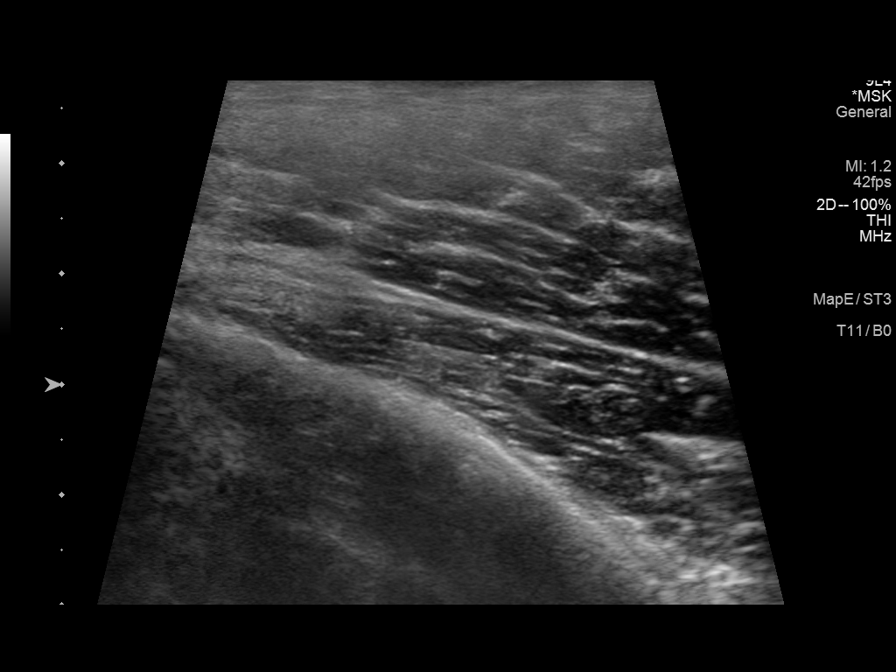
[im 3/8]
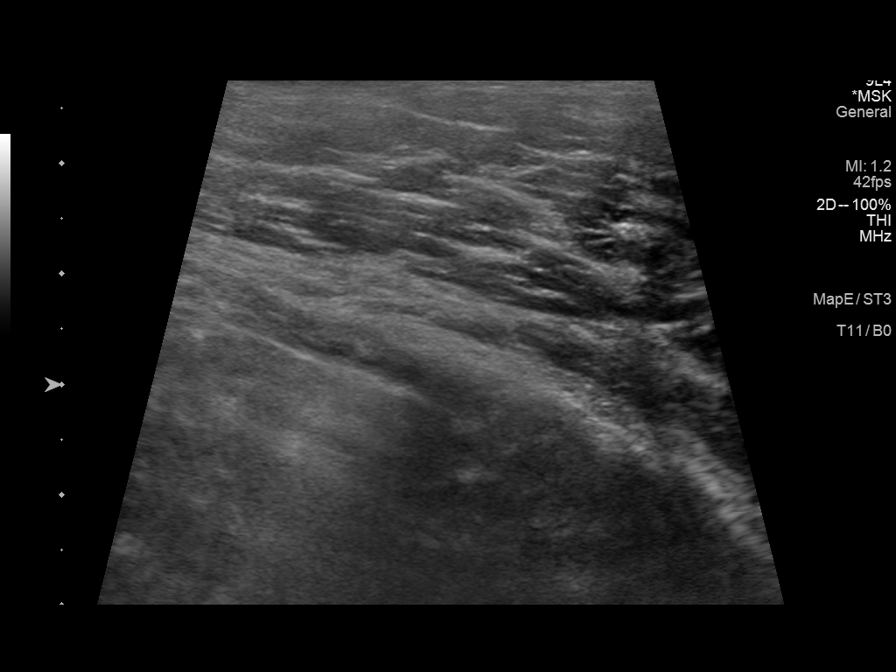
[im 4/8]
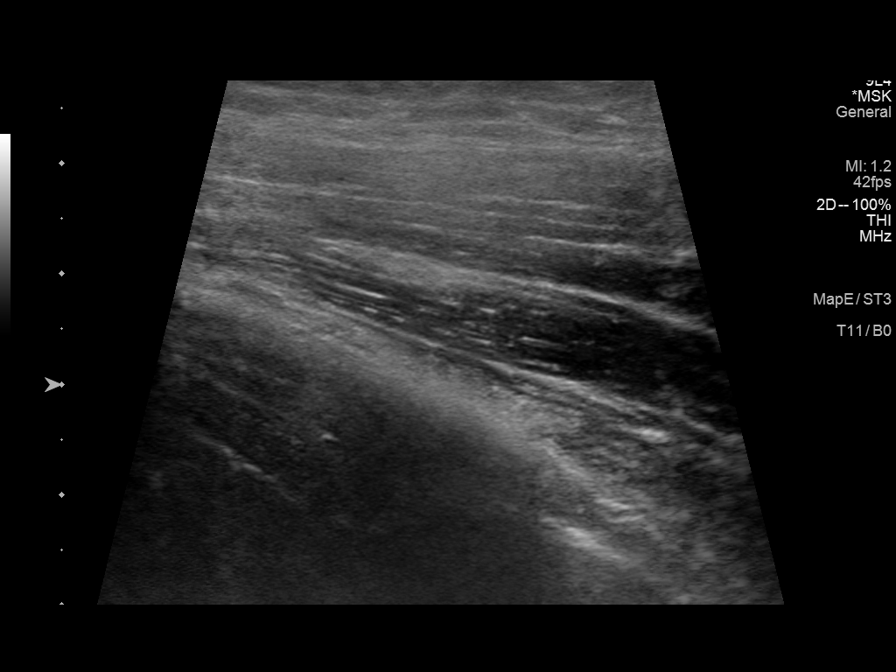
[im 5/8]
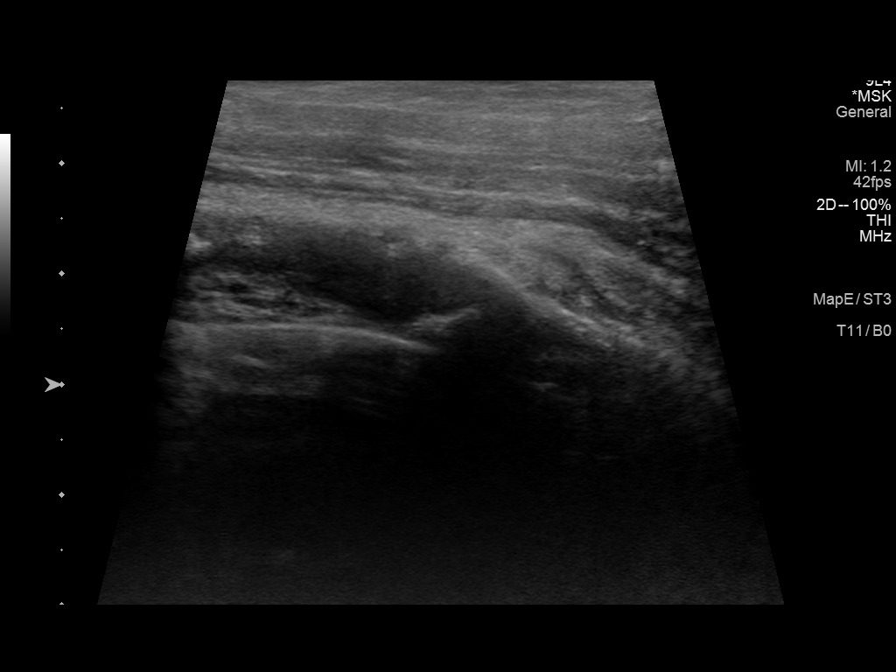
[im 6/8]
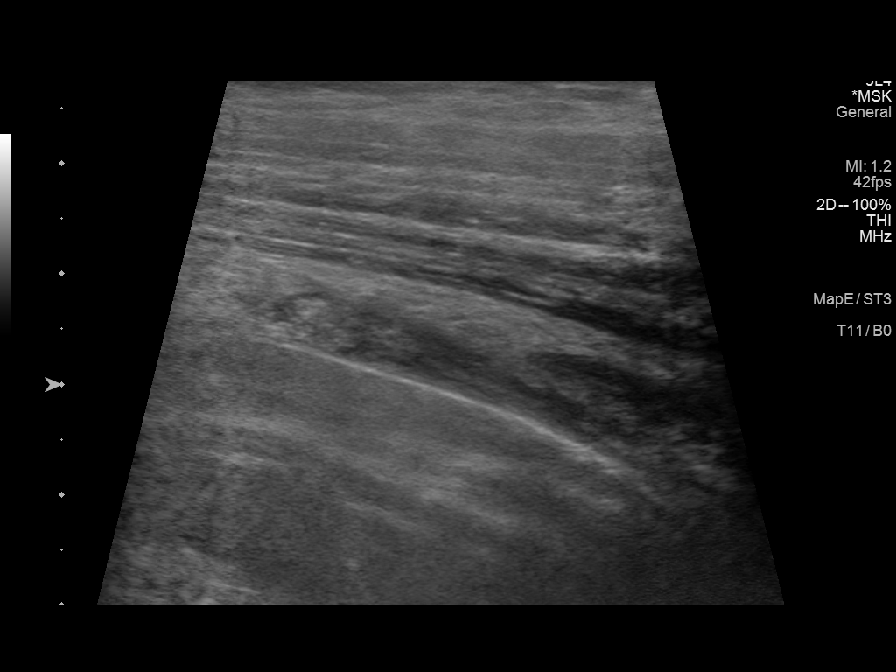
[im 7/8]
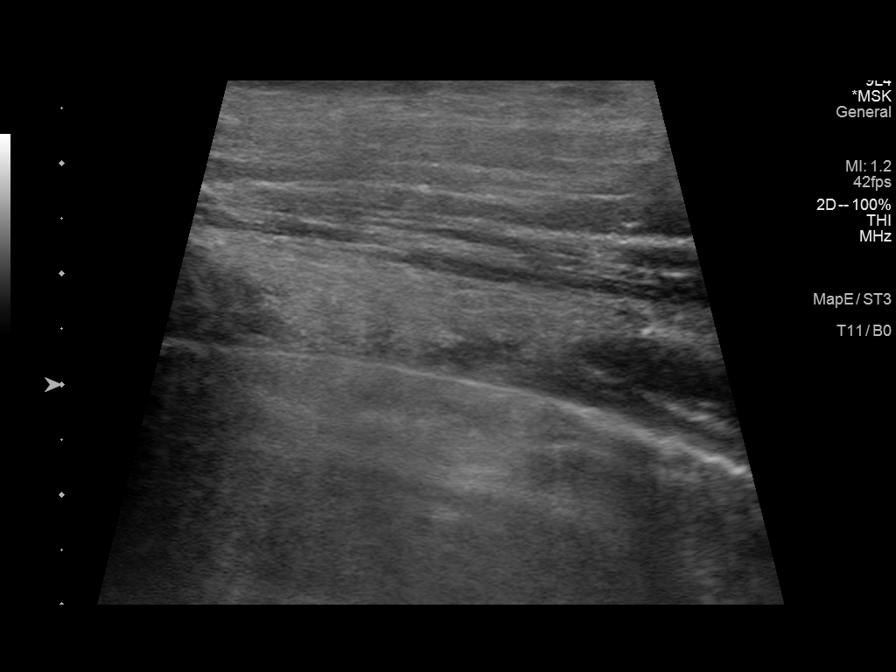
[im 8/8]
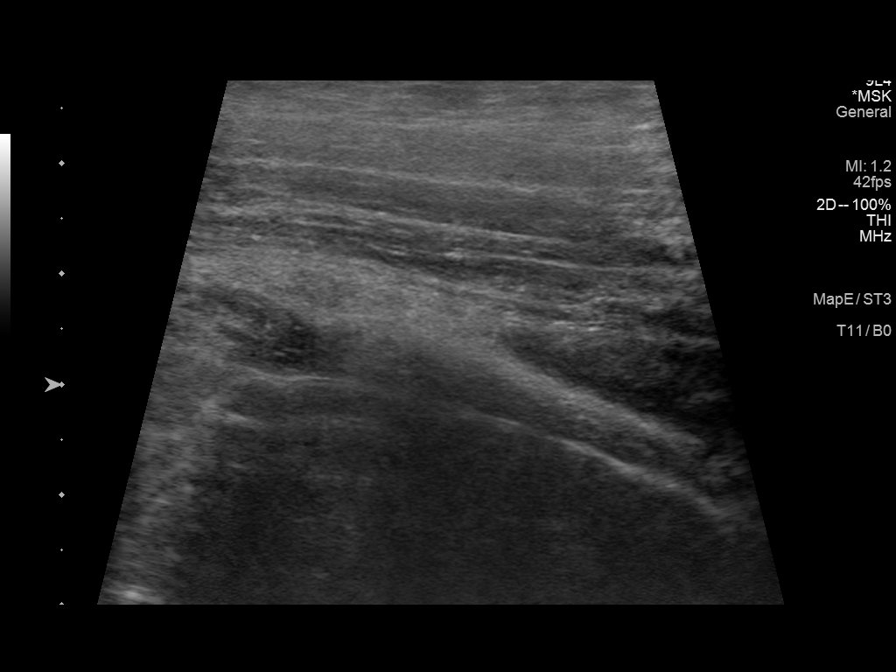

[8 of 8 positions shown; findings below may reference images not displayed]

FINDINGS: No focal abnormal discrete cystic or solid lesion is identified in
the palpable area right chest/swelling.
IMPRESSION: No focal abnormal cystic or solid lesion identified in the palpable
area right chest/swelling.

## 2023-02-23 MED ORDER — OXYCODONE HCL 5 MG PO TABS
5.0000 mg | ORAL_TABLET | Freq: Once | ORAL | Status: AC
  Administered 2023-02-23: 5 mg via ORAL
  Filled 2023-02-23: qty 1

## 2023-02-23 NOTE — ED Provider Notes (Signed)
[facility]-EMERGENCY DEPT [facility] Emergency Department Provider Note MRN:  [id number]  Arrival date & time: 02/24/23     Chief Complaint   Hand Pain   History of Present Illness   Barry Wood is a 52 y.o. year-old male with no pertinent past medical history presenting to the ED with chief complaint of hand pain.  Painful rash to the hands and feet.  Feet started a week or 2 ago, hands a few days ago.  Skin seems to be falling off.  Wears the shoes a lot.  Denies any preceding febrile illness, denies history of diabetes, no new medications.  Review of Systems  A thorough review of systems was obtained and all systems are negative except as noted in the HPI and PMH.   Patient's Health History    Past Medical History:  Diagnosis Date   Erectile dysfunction    Gonorrhea    Prediabetes    Vitamin D deficiency     Past Surgical History:  Procedure Laterality Date   CYST EXCISION      Family History  Problem Relation Age of Onset   Heart disease Mother    Cancer Mother        Kidney   Heart disease Father     Social History   Socioeconomic History   Marital status: Married    Spouse name: Not on file   Number of children: Not on file   Years of education: Not on file   Highest education level: Not on file  Occupational History   Not on file  Tobacco Use   Smoking status: Never   Smokeless tobacco: Never  Vaping Use   Vaping status: Never Used  Substance and Sexual Activity   Alcohol use: Yes    Alcohol/week: 4.0 standard drinks of alcohol    Types: 4 Shots of liquor per week    Comment: Occasional   Drug use: Yes    Types: Marijuana   Sexual activity: Yes    Birth control/protection: None  Other Topics Concern   Not on file  Social History Narrative   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   Social Determinants of Health   Financial Resource Strain: Not on file  Food Insecurity: Not on file  Transportation Needs:  Not on file  Physical Activity: Not on file  Stress: Not on file  Social Connections: Not on file  Intimate Partner Violence: Not on file     Physical Exam   Vitals:   02/23/23 2144  BP: (!) 139/97  Pulse: 76  Resp: 16  Temp: 98.6 F (37 C)  SpO2: 99%    CONSTITUTIONAL: Well-appearing, NAD NEURO/PSYCH:  Alert and oriented x 3, no focal deficits EYES:  eyes equal and reactive ENT/NECK:  no LAD, no JVD CARDIO: Regular rate, well-perfused, normal S1 and S2 PULM:  CTAB no wheezing or rhonchi GI/GU:  non-distended, non-tender MSK/SPINE:  No gross deformities, no edema SKIN: Desquamatous rash to the hands and feet worse at the feet   *Additional and/or pertinent findings included in MDM below  Diagnostic and Interventional Summary    EKG Interpretation Date/Time:    Ventricular Rate:    PR Interval:    QRS Duration:    QT Interval:    QTC Calculation:   R Axis:      Text Interpretation:         Labs Reviewed  CBC - Abnormal; Notable for the following components:  Result Value   RBC 3.84 (*)    Hemoglobin 12.4 (*)    HCT 37.3 (*)    All other components within normal limits  COMPREHENSIVE METABOLIC PANEL - Abnormal; Notable for the following components:   Calcium 8.8 (*)    All other components within normal limits  RAPID HIV SCREEN (HIV 1/2 AB+AG)  RPR    No orders to display    Medications  oxyCODONE (Oxy IR/ROXICODONE) immediate release tablet 5 mg (5 mg Oral Given 02/23/23 2313)     Procedures  /  Critical Care Procedures  ED Course and Medical Decision Making  Initial Impression and Ddx Patient's feet are swollen and there is seems to be a desquamating process.  However not having any systemic symptoms, no prodrome and so doubt toxic shock syndrome or similar processes.  Vitals normal.  Wearing his shoes very often and they are very often wet and so there could be a component of trench foot.  Secondary bacterial infection is possible.  Also  considering primary HIV or syphilis.  Past medical/surgical history that increases complexity of ED encounter: None  Interpretation of Diagnostics I personally reviewed the laboratory assessment and my interpretation is as follows: No significant blood count or electrolyte disturbance    Patient Reassessment and Ultimate Disposition/Management     No evidence of life or limb threatening condition at this time.  Will send patient home on antibiotics to cover secondary bacterial infection, emollient, dermatology follow-up  Patient management required discussion with the following services or consulting groups:  None  Complexity of Problems Addressed Acute illness or injury that poses threat of life of bodily function  Additional Data Reviewed and Analyzed Further history obtained from: Prior ED visit notes and Prior labs/imaging results  Additional Factors Impacting ED Encounter Risk Prescriptions  Elmer Sow. Pilar Plate, MD [facility] Emergency Medicine [facility] [email or web address]  Final Clinical Impressions(s) / ED Diagnoses     ICD-10-CM   1. Rash  R21       ED Discharge Orders          Ordered    doxycycline (VIBRAMYCIN) 100 MG capsule  2 times daily        02/24/23 0106    emollient (RADIAGEL) gel  As needed        02/24/23 0106    oxyCODONE (ROXICODONE) 5 MG immediate release tablet  Every 4 hours PRN        02/24/23 0106    naproxen (NAPROSYN) 500 MG tablet  2 times daily        02/24/23 0106             Discharge Instructions Discussed with and Provided to Patient:     Discharge Instructions      You were evaluated in the Emergency Department and after careful evaluation, we did not find any emergent condition requiring admission or further testing in the hospital.  Your exam/testing today was overall reassuring.  Take the doxycycline antibiotic as directed to help treat any bacterial infection.  Recommend soaking the feet in warm  soapy water 2 or 3 times daily.  When not soaking the feet, recommend using the emollient gel provided.  Use the Naprosyn twice daily for pain.  Use the oxycodone as needed for more significant pain.  Follow-up with dermatology.  Please return to the Emergency Department if you experience any worsening of your condition.  Thank you for allowing [place] to be a part of your care.  Sabas Sous, MD 02/24/23 [phone]

## 2023-02-23 NOTE — ED Triage Notes (Addendum)
C/o abrasions and dry skin to bilateral hands and bilateral feet x 1.5 week.  Patient reports clear drainage to abrasions on feet.

## 2023-02-24 LAB — RAPID HIV SCREEN (HIV 1/2 AB+AG)
HIV 1/2 Antibodies: NONREACTIVE
HIV-1 P24 Antigen - HIV24: NONREACTIVE

## 2023-02-24 LAB — RPR: RPR Ser Ql: NONREACTIVE

## 2023-02-24 MED ORDER — OXYCODONE HCL 5 MG PO TABS
5.0000 mg | ORAL_TABLET | ORAL | 0 refills | Status: DC | PRN
Start: 2023-02-24 — End: 2023-03-03

## 2023-02-24 MED ORDER — NAPROXEN 500 MG PO TABS: 500.0000 mg | ORAL_TABLET | Freq: Two times a day (BID) | ORAL | 0 refills | Status: AC

## 2023-02-24 MED ORDER — DIAB EX GEL: CUTANEOUS | 0 refills | Status: AC | PRN

## 2023-02-24 MED ORDER — DOXYCYCLINE HYCLATE 100 MG PO CAPS: 100.0000 mg | ORAL_CAPSULE | Freq: Two times a day (BID) | ORAL | 0 refills | Status: DC

## 2023-02-24 NOTE — ED Notes (Signed)
Not present in room for discharge. Discharge instructions reviewed by another nurse.

## 2023-02-24 NOTE — Discharge Instructions (Signed)
You were evaluated in the Emergency Department and after careful evaluation, we did not find any emergent condition requiring admission or further testing in the hospital.  Your exam/testing today was overall reassuring.  Take the doxycycline antibiotic as directed to help treat any bacterial infection.  Recommend soaking the feet in warm soapy water 2 or 3 times daily.  When not soaking the feet, recommend using the emollient gel provided.  Use the Naprosyn twice daily for pain.  Use the oxycodone as needed for more significant pain.  Follow-up with dermatology.  Please return to the Emergency Department if you experience any worsening of your condition.  Thank you for allowing [place] to be a part of your care.

## 2023-02-28 ENCOUNTER — Encounter: Payer: Self-pay | Admitting: Internal Medicine

## 2023-02-28 NOTE — Progress Notes (Deleted)
    Subjective:    Patient ID: Barry Wood, Barry Wood    DOB: 01-26-1971, 52 y.o.   MRN: [id number]      HPI Barry Wood is here for No chief complaint on file.    Pain and rash on feet - seen in ED 10/3.  Feet rash which is painful started 1-2 weeks prior, hands a few days prior.  Skin was falling off.  No preceding febrile illness.   RPR, HIV negative.  Started on naproxen, oxycodone, doxycycline and emollient gel.  Feet were swollen, skin was peeling - no prodrome.  Wearing shoes very often and often wet - ? Trench foot.  ? Secondary bacterial process.       Medications and allergies reviewed with patient and updated if appropriate.  Current Outpatient Medications on File Prior to Visit  Medication Sig Dispense Refill   acetaminophen (TYLENOL) 500 MG tablet Take 500-1,000 mg by mouth every 8 (eight) hours as needed.     chlorhexidine (PERIDEX) 0.12 % solution      Cholecalciferol (VITAMIN D-3) 125 MCG (5000 UT) TABS Take 2 tablets by mouth daily.     cyclobenzaprine (FLEXERIL) 5 MG tablet Take 1 tablet (5 mg total) by mouth 3 (three) times daily as needed for muscle spasms. Do not take while driving or operating heavy machinery 30 tablet 0   doxycycline (VIBRAMYCIN) 100 MG capsule Take 1 capsule (100 mg total) by mouth 2 (two) times daily. 20 capsule 0   emollient (RADIAGEL) gel Apply topically as needed for wound care. 85 g 0   ibuprofen (ADVIL) 800 MG tablet Take 800 mg by mouth every 6 (six) hours as needed.     MULTIPLE VITAMIN PO Take 1 capsule by mouth daily.     naproxen (NAPROSYN) 500 MG tablet Take 1 tablet (500 mg total) by mouth 2 (two) times daily. 30 tablet 0   oxyCODONE (ROXICODONE) 5 MG immediate release tablet Take 1 tablet (5 mg total) by mouth every 4 (four) hours as needed for severe pain. 8 tablet 0   sildenafil (VIAGRA) 100 MG tablet Take 1 tablet (100 mg total) by mouth daily as needed for erectile dysfunction. Do not exceed 1 tablet in 24 hours 10 tablet 2    thiamine 100 MG tablet Take 1 tablet (100 mg total) by mouth daily. 90 tablet 3   No current facility-administered medications on file prior to visit.    Review of Systems     Objective:  There were no vitals filed for this visit. BP Readings from Last 3 Encounters:  02/23/23 (!) 139/97  01/27/23 134/86  07/15/22 118/68   Wt Readings from Last 3 Encounters:  02/23/23 185 lb 3 oz (84 kg)  01/27/23 187 lb (84.8 kg)  07/15/22 187 lb 4 oz (84.9 kg)   There is no height or weight on file to calculate BMI.    Physical Exam         Assessment & Plan:    See Problem List for Assessment and Plan of chronic medical problems.

## 2023-03-01 ENCOUNTER — Ambulatory Visit: Payer: Medicaid Other | Admitting: Internal Medicine

## 2023-03-01 DIAGNOSIS — R21 Rash and other nonspecific skin eruption: Secondary | ICD-10-CM

## 2023-03-02 ENCOUNTER — Ambulatory Visit: Payer: Medicaid Other | Admitting: Internal Medicine

## 2023-03-02 NOTE — Progress Notes (Deleted)
    Subjective:    Patient ID: Barry Wood, male    DOB: 01-26-1971, 52 y.o.   MRN: [id number]      HPI Barry Wood is here for No chief complaint on file.    Pain and rash on feet - seen in ED 10/3.  Feet rash which is painful started 1-2 weeks prior, hands a few days prior.  Skin was falling off.  No preceding febrile illness.   RPR, HIV negative.  Started on naproxen, oxycodone, doxycycline and emollient gel.  Feet were swollen, skin was peeling - no prodrome.  Wearing shoes very often and often wet - ? Trench foot.  ? Secondary bacterial process.       Medications and allergies reviewed with patient and updated if appropriate.  Current Outpatient Medications on File Prior to Visit  Medication Sig Dispense Refill   acetaminophen (TYLENOL) 500 MG tablet Take 500-1,000 mg by mouth every 8 (eight) hours as needed.     chlorhexidine (PERIDEX) 0.12 % solution      Cholecalciferol (VITAMIN D-3) 125 MCG (5000 UT) TABS Take 2 tablets by mouth daily.     cyclobenzaprine (FLEXERIL) 5 MG tablet Take 1 tablet (5 mg total) by mouth 3 (three) times daily as needed for muscle spasms. Do not take while driving or operating heavy machinery 30 tablet 0   doxycycline (VIBRAMYCIN) 100 MG capsule Take 1 capsule (100 mg total) by mouth 2 (two) times daily. 20 capsule 0   emollient (RADIAGEL) gel Apply topically as needed for wound care. 85 g 0   ibuprofen (ADVIL) 800 MG tablet Take 800 mg by mouth every 6 (six) hours as needed.     MULTIPLE VITAMIN PO Take 1 capsule by mouth daily.     naproxen (NAPROSYN) 500 MG tablet Take 1 tablet (500 mg total) by mouth 2 (two) times daily. 30 tablet 0   oxyCODONE (ROXICODONE) 5 MG immediate release tablet Take 1 tablet (5 mg total) by mouth every 4 (four) hours as needed for severe pain. 8 tablet 0   sildenafil (VIAGRA) 100 MG tablet Take 1 tablet (100 mg total) by mouth daily as needed for erectile dysfunction. Do not exceed 1 tablet in 24 hours 10 tablet 2    thiamine 100 MG tablet Take 1 tablet (100 mg total) by mouth daily. 90 tablet 3   No current facility-administered medications on file prior to visit.    Review of Systems     Objective:  There were no vitals filed for this visit. BP Readings from Last 3 Encounters:  02/23/23 (!) 139/97  01/27/23 134/86  07/15/22 118/68   Wt Readings from Last 3 Encounters:  02/23/23 185 lb 3 oz (84 kg)  01/27/23 187 lb (84.8 kg)  07/15/22 187 lb 4 oz (84.9 kg)   There is no height or weight on file to calculate BMI.    Physical Exam         Assessment & Plan:    See Problem List for Assessment and Plan of chronic medical problems.

## 2023-03-03 ENCOUNTER — Encounter: Payer: Self-pay | Admitting: Nurse Practitioner

## 2023-03-03 ENCOUNTER — Ambulatory Visit: Payer: Medicaid Other | Admitting: Nurse Practitioner

## 2023-03-03 VITALS — BP 136/80 | HR 83 | Temp 98.3°F | Ht 69.0 in

## 2023-03-03 DIAGNOSIS — B351 Tinea unguium: Secondary | ICD-10-CM | POA: Diagnosis not present

## 2023-03-03 DIAGNOSIS — L97322 Non-pressure chronic ulcer of left ankle with fat layer exposed: Secondary | ICD-10-CM | POA: Diagnosis not present

## 2023-03-03 LAB — SEDIMENTATION RATE: Sed Rate: 25 mm/h — ABNORMAL HIGH (ref 0–20)

## 2023-03-03 MED ORDER — TERBINAFINE HCL 250 MG PO TABS: 250.0000 mg | ORAL_TABLET | Freq: Every day | ORAL | 0 refills | Status: DC

## 2023-03-03 MED ORDER — OXYCODONE HCL 5 MG PO TABS: 5.0000 mg | ORAL_TABLET | ORAL | 0 refills | Status: DC | PRN

## 2023-03-03 NOTE — Progress Notes (Signed)
Established Patient Office Visit  Subjective   Patient ID: Barry Wood, male    DOB: September 17, 1970  Age: 52 y.o. MRN: [id number]  Chief Complaint  Patient presents with   Wound Infection    Wound on the feet, redness, peeling and smell to it. Both palms are cracked with some open sore. Skin on both feet and hands are raw    Estimated Creatinine Clearance: 77 mL/min (by C-G formula based on SCr of 1.11 mg/dL).    Wound to feet and hands: Patient arrives today for the above. He went to the ER for same complaint on 02/24/23.  At that time the wound started a week or 2 before that.  Reports initially started on his hands after he pricked his finger on a tack strip.  But otherwise denies any trauma or lacerations to hands or feet.  Skin initially started cracking and was painful, and now skin seems to be sloughing off.  He is being treated with doxycycline as well as oxycodone.  Symptoms have not improved much.  Labs in ER were completed including testing for syphilis and HIV both of which came back negative.  Denies any new medications. Does admit to wearing shoes frequently and frequent exposure to moisture in shoes. He reports he is in severe pain and is having a hard time washing/changing dressings due to the pain.     ROS: see HPI    Objective:     BP 136/80   Pulse 83   Temp 98.3 F (36.8 C) (Temporal)   Ht 5\' 9"  (1.753 m)   SpO2 96%   BMI 27.35 kg/m    Physical Exam Vitals reviewed.  Constitutional:      Appearance: Normal appearance.  HENT:     Head: Normocephalic and atraumatic.  Cardiovascular:     Rate and Rhythm: Normal rate and regular rhythm.  Pulmonary:     Effort: Pulmonary effort is normal.     Breath sounds: Normal breath sounds.  Musculoskeletal:     Cervical back: Neck supple.  Skin:    General: Skin is warm and dry.     Comments: Bilateral dry, cracking skin to hands  Bilateral feet are swollen, with redness, cracking skin, white discharge,  tender to touch 2+ dorsalis pedis pulse bilaterally  Neurological:     Mental Status: He is alert and oriented to person, place, and time.  Psychiatric:        Mood and Affect: Mood normal.        Behavior: Behavior normal.        Thought Content: Thought content normal.        Judgment: Judgment normal.      No results found for any visits on 03/03/23.    The 10-year ASCVD risk score (Arnett DK, et al., 2019) is: 6.7%    Assessment & Plan:   Problem List Items Addressed This Visit       Musculoskeletal and Integument   Lower limb ulcer, ankle, left, with fat layer exposed (HCC) - Primary    Acute Referral to wound clinic ordered today Obtain wound culture Continue doxycycline Did ask backup supervising physician Dr. Yetta Barre to assess patient as well.  He was able to come into the room and assessed patient.  He feels the most likely cause of wounds is onychomycosis.  Will start patient on oral terbinafine, patient does drink alcohol regularly but reports not drinking it daily.  He was told to abstain from all alcohol  while on terbinafine.  Patient to return in 4 weeks to evaluate wounds as well as recheck liver enzymes. For now he was recommended to change dressings with nonadherent bandage as well as use of Coban once a day.  Clean with warm soapy water as well.      Relevant Medications   oxyCODONE (ROXICODONE) 5 MG immediate release tablet   Other Relevant Orders   Ambulatory referral to Wound Clinic   Anaerobic and Aerobic Culture   Sedimentation rate   Onychomycosis    Acute Referral to wound clinic ordered today Obtain wound culture Continue doxycycline Did ask backup supervising physician Dr. Yetta Barre to assess patient as well.  He was able to come into the room and assessed patient.  He feels the most likely cause of wounds is onychomycosis.  Will start patient on oral terbinafine, patient does drink alcohol regularly but reports not drinking it daily.  He was told to  abstain from all alcohol while on terbinafine.  Patient to return in 4 weeks to evaluate wounds as well as recheck liver enzymes. For now he was recommended to change dressings with nonadherent bandage as well as use of Coban once a day.  Clean with warm soapy water as well. Check sed rate to screen for vasculitis as well.      Relevant Medications   terbinafine (LAMISIL) 250 MG tablet   Other Relevant Orders   Sedimentation rate    Return in about 4 weeks (around 03/31/2023) for F/U with Maralyn Sago - may override my schedule if needed. Must be seen in 4 weeks.  Total time spent on encounter was 44 minutes including face-to-face evaluation, consultation with supervising physician, development and discussion of treatment plan.   Elenore Paddy, NP

## 2023-03-03 NOTE — Assessment & Plan Note (Signed)
Acute Referral to wound clinic ordered today Obtain wound culture Continue doxycycline Did ask backup supervising physician Dr. Yetta Barre to assess patient as well.  He was able to come into the room and assessed patient.  He feels the most likely cause of wounds is onychomycosis.  Will start patient on oral terbinafine, patient does drink alcohol regularly but reports not drinking it daily.  He was told to abstain from all alcohol while on terbinafine.  Patient to return in 4 weeks to evaluate wounds as well as recheck liver enzymes. For now he was recommended to change dressings with nonadherent bandage as well as use of Coban once a day.  Clean with warm soapy water as well.

## 2023-03-03 NOTE — Patient Instructions (Signed)
Do not mix alcohol with terbinafine. Any abdominal pain, nausea, vomiting, change in skin color, change in stool color needs evaluation in person. So call office if this occurs.   I will also refer you to wound clinic.

## 2023-03-03 NOTE — Assessment & Plan Note (Addendum)
Acute Referral to wound clinic ordered today Obtain wound culture Continue doxycycline Did ask backup supervising physician Dr. Yetta Barre to assess patient as well.  He was able to come into the room and assessed patient.  He feels the most likely cause of wounds is onychomycosis.  Will start patient on oral terbinafine, patient does drink alcohol regularly but reports not drinking it daily.  He was told to abstain from all alcohol while on terbinafine.  Patient to return in 4 weeks to evaluate wounds as well as recheck liver enzymes. For now he was recommended to change dressings with nonadherent bandage as well as use of Coban once a day.  Clean with warm soapy water as well. Check sed rate to screen for vasculitis as well.

## 2023-03-07 ENCOUNTER — Telehealth: Payer: Self-pay | Admitting: Nurse Practitioner

## 2023-03-07 ENCOUNTER — Other Ambulatory Visit: Payer: Self-pay | Admitting: Family

## 2023-03-07 DIAGNOSIS — L97322 Non-pressure chronic ulcer of left ankle with fat layer exposed: Secondary | ICD-10-CM

## 2023-03-07 MED ORDER — OXYCODONE HCL 5 MG PO TABS: 5.0000 mg | ORAL_TABLET | ORAL | 0 refills | Status: DC | PRN

## 2023-03-07 NOTE — Telephone Encounter (Signed)
Patient saw Jiles Prows on 03/03/23 for cracked feet. He was prescribed oxyCODONE (ROXICODONE) 5 MG immediate release tablet and said he has completed the prescription. He wanted to know if he can get a refill. He also would like to know if there are any creams/ointments he can use to help with the cracks. Patient would like a call back at [phone].

## 2023-03-09 ENCOUNTER — Telehealth: Payer: Self-pay | Admitting: Nurse Practitioner

## 2023-03-09 LAB — ANAEROBIC AND AEROBIC CULTURE

## 2023-03-09 LAB — CULTURE, BLOOD (SINGLE)

## 2023-03-09 NOTE — Telephone Encounter (Signed)
Been out of work for the time my doctors note says to be out.  My feet is not stable to go back to work and I don't go to the wound doc untill the 29th of oct swollen and it hasn't went down and wondering if that's coming from my wounds. I really feel like I need a extension on my doctors not can't add to much pressure to walk like I need to.

## 2023-03-10 ENCOUNTER — Encounter: Payer: Self-pay | Admitting: Nurse Practitioner

## 2023-03-10 ENCOUNTER — Other Ambulatory Visit: Payer: Self-pay | Admitting: Nurse Practitioner

## 2023-03-10 DIAGNOSIS — L97322 Non-pressure chronic ulcer of left ankle with fat layer exposed: Secondary | ICD-10-CM

## 2023-03-10 MED ORDER — CIPROFLOXACIN HCL 750 MG PO TABS: 750.0000 mg | ORAL_TABLET | Freq: Two times a day (BID) | ORAL | 0 refills | Status: DC

## 2023-03-10 NOTE — Progress Notes (Signed)
Please patient let him know that his wound culture did identify 2 different bacteria that will require treatment with an antibiotic called ciprofloxacin.  He needs to take 1 tablet by mouth twice a day for 1 week.  If after 1 week he he feels some improvement in his wound but it has not fully resolved he can reach out to [place] at which point I can extend the ciprofloxacin.  Ciprofloxacin is usually well-tolerated but is associated with significant adverse reactions including tendon rupture, aortic aneurysm/dissection, C. difficile diarrhea, increase for severe sunburns, liver toxicity.  Thus, if he experiences any chest pain, new or worsening joint pain, severe diarrhea or abdominal pain he needs to call our office.  He should also avoid direct sunlight while taking the medication.  Please let me know if he has any questions.

## 2023-03-10 NOTE — Telephone Encounter (Signed)
Pt called back asking can he extend his doctors note out until 10/28  because he is unable to stand on his feet. Please advise.

## 2023-03-10 NOTE — Progress Notes (Signed)
Made pt aware of lab results about medication send to pharmacy and instruction on how to take medication. Let pt be aware after 1 week, if condition has not improve to call the office. Pt showed understanding and will be picking up medication.

## 2023-03-13 ENCOUNTER — Encounter: Payer: Self-pay | Admitting: Radiology

## 2023-03-13 ENCOUNTER — Telehealth: Payer: Self-pay | Admitting: Nurse Practitioner

## 2023-03-13 DIAGNOSIS — L97322 Non-pressure chronic ulcer of left ankle with fat layer exposed: Secondary | ICD-10-CM

## 2023-03-13 NOTE — Telephone Encounter (Signed)
Prescription Request  03/13/2023  LOV: 03/03/2023  What is the name of the medication or equipment?  oxyCODONE (ROXICODONE) 5 MG immediate release tablet   Have you contacted your pharmacy to request a refill? Yes   Which pharmacy would you like this sent to?  [id number] - [place], [place] - [address] [place] [address] Phone: [phone] Fax: [phone]   Patient notified that their request is being sent to the clinical staff for review and that they should receive a response within 2 business days.   Please advise at Mobile [phone] (mobile)

## 2023-03-13 NOTE — Telephone Encounter (Signed)
Patient called and said that he just wanted to know what would have made his hands have like a whole layer of dead skin on them?  Is it from the infection?  Please advise

## 2023-03-14 NOTE — Telephone Encounter (Signed)
Patient called to check on the status of his refill. He wanted to know if any other providers can send it in for him. Best callback is [phone].

## 2023-03-15 MED ORDER — OXYCODONE HCL 5 MG PO TABS: 5.0000 mg | ORAL_TABLET | Freq: Four times a day (QID) | ORAL | 0 refills | Status: AC | PRN

## 2023-03-15 NOTE — Telephone Encounter (Signed)
Spoke with patient and he did not want to make an appointment.

## 2023-03-15 NOTE — Telephone Encounter (Signed)
Not sure.  Needs office visit.  Thanks.

## 2023-03-15 NOTE — Telephone Encounter (Signed)
[place] controlled database reviewed. Refill approved.

## 2023-03-20 ENCOUNTER — Telehealth: Payer: Self-pay | Admitting: Nurse Practitioner

## 2023-03-20 NOTE — Telephone Encounter (Signed)
Prescription Request  03/20/2023  LOV: 03/03/2023  What is the name of the medication or equipment? hydrocodone  Have you contacted your pharmacy to request a refill? Yes   Which pharmacy would you like this sent to?  [id number] - [place], [place] - [address] [place] [address] Phone: [phone] Fax: [phone]     Patient notified that their request is being sent to the clinical staff for review and that they should receive a response within 2 business days.   Please advise at Mobile [phone] (mobile)

## 2023-03-21 NOTE — Telephone Encounter (Signed)
Pt called back checking on the status of his Rx.

## 2023-03-22 ENCOUNTER — Encounter (HOSPITAL_BASED_OUTPATIENT_CLINIC_OR_DEPARTMENT_OTHER): Payer: Medicaid Other | Attending: General Surgery | Admitting: General Surgery

## 2023-03-22 DIAGNOSIS — L988 Other specified disorders of the skin and subcutaneous tissue: Secondary | ICD-10-CM | POA: Insufficient documentation

## 2023-03-22 DIAGNOSIS — R234 Changes in skin texture: Secondary | ICD-10-CM | POA: Diagnosis present

## 2023-03-23 NOTE — Progress Notes (Addendum)
SIG, CLISHAM [name] [id number] Page 1 of 16 Visit Report for 03/22/2023 Allergy List Details Patient Name: Date of Service: Barry Wood, Barry Wood 03/22/2023 12:45 PM Medical Record Number: [id number] Patient Account Number: [id number] Date of Birth/Sex: Treating RN: May 13, 1971 (52 y.o. [name] Primary Care Temitope Flammer: Jiles Prows Other Clinician: Referring Kealohilani Maiorino: Treating Glendine Swetz/Extender: Darci Current in Treatment: 0 Allergies Active Allergies Valium Allergy Notes Electronic Signature(s) Signed: 03/22/2023 5:03:07 PM By: Redmond Pulling RN, BSN Entered By: Redmond Pulling on 03/22/2023 12:58:22 -------------------------------------------------------------------------------- Arrival Information Details Patient Name: Date of Service: Barry Wood. 03/22/2023 12:45 PM Medical Record Number: [id number] Patient Account Number: [id number] Date of Birth/Sex: Treating RN: 07/04/1970 (52 y.o. [name] Primary Care Artavis Cowie: Jiles Prows Other Clinician: Referring Azure Budnick: Treating Davanna He/Extender: Darci Current in Treatment: 0 Visit Information Patient Arrived: Ambulatory Arrival Time: 12:53 Accompanied By: self Transfer Assistance: None Patient Identification Verified: Yes Secondary Verification Process Completed: Yes Electronic Signature(s) Signed: 03/22/2023 5:03:07 PM By: Redmond Pulling RN, BSN Entered By: Redmond Pulling on 03/22/2023 12:56:13 -------------------------------------------------------------------------------- Clinic Level of Care Assessment Details Patient Name: Date of Service: Barry Wood, Barry Wood 03/22/2023 12:45 PM Medical Record Number: [id number] Patient Account Number: [id number] Date of Birth/Sex: Treating RN: 1970-11-29 (52 y.o. [name] Primary Care Cinsere Mizrahi: Jiles Prows Other Clinician: CAIMAN, PINTO [name]  [id number] Page 2 of 16 Referring Rock Sobol: Treating Maison Agrusa/Extender: Darci Current in Treatment: 0 Clinic Level of Care Assessment Items TOOL 2 Quantity Score X- 1 0 Use when only an EandM is performed on the INITIAL visit ASSESSMENTS - Nursing Assessment / Reassessment X- 1 20 General Physical Exam (combine w/ comprehensive assessment (listed just below) when performed on new pt. evals) X- 1 25 Comprehensive Assessment (HX, ROS, Risk Assessments, Wounds Hx, etc.) ASSESSMENTS - Wound and Skin A ssessment / Reassessment []  - 0 Simple Wound Assessment / Reassessment - one wound X- 6 5 Complex Wound Assessment / Reassessment - multiple wounds []  - 0 Dermatologic / Skin Assessment (not related to wound area) ASSESSMENTS - Ostomy and/or Continence Assessment and Care []  - 0 Incontinence Assessment and Management []  - 0 Ostomy Care Assessment and Management (repouching, etc.) PROCESS - Coordination of Care X - Simple Patient / Family Education for ongoing care 1 15 []  - 0 Complex (extensive) Patient / Family Education for ongoing care X- 1 10 Staff obtains Chiropractor, Records, T Results / Process Orders est []  - 0 Staff telephones HHA, Nursing Homes / Clarify orders / etc []  - 0 Routine Transfer to another Facility (non-emergent condition) []  - 0 Routine Hospital Admission (non-emergent condition) X- 1 15 New Admissions / Manufacturing engineer / Ordering NPWT Apligraf, etc. , []  - 0 Emergency Hospital Admission (emergent condition) []  - 0 Simple Discharge Coordination []  - 0 Complex (extensive) Discharge Coordination PROCESS - Special Needs []  - 0 Pediatric / Minor Patient Management []  - 0 Isolation Patient Management []  - 0 Hearing / Language / Visual special needs []  - 0 Assessment of Community assistance (transportation, D/C planning, etc.) []  - 0 Additional assistance / Altered mentation []  - 0 Support  Surface(s) Assessment (bed, cushion, seat, etc.) INTERVENTIONS - Wound Cleansing / Measurement X- 1 5 Wound Imaging (photographs - any number of wounds) []  - 0 Wound Tracing (instead of photographs) []  - 0 Simple Wound Measurement - one wound X- 6 5 Complex Wound Measurement - multiple wounds []  - 0 Simple Wound Cleansing -  one wound X- 6 5 Complex Wound Cleansing - multiple wounds INTERVENTIONS - Wound Dressings []  - 0 Small Wound Dressing one or multiple wounds X- 2 15 Medium Wound Dressing one or multiple wounds []  - 0 Large Wound Dressing one or multiple wounds []  - 0 Application of Medications - injection INTERVENTIONS - Miscellaneous Barry Wood, Barry [name] [id number] Page 3 of 16 []  - 0 External ear exam []  - 0 Specimen Collection (cultures, biopsies, blood, body fluids, etc.) []  - 0 Specimen(s) / Culture(s) sent or taken to Lab for analysis []  - 0 Patient Transfer (multiple staff / Michiel Sites Lift / Similar devices) []  - 0 Simple Staple / Suture removal (25 or less) []  - 0 Complex Staple / Suture removal (26 or more) []  - 0 Hypo / Hyperglycemic Management (close monitor of Blood Glucose) X- 1 15 Ankle / Brachial Index (ABI) - do not check if billed separately Has the patient been seen at the hospital within the last three years: Yes Total Score: 225 Level Of Care: New/Established - Level 5 Electronic Signature(s) Signed: 03/22/2023 5:03:07 PM By: Redmond Pulling RN, BSN Entered By: Redmond Pulling on 03/22/2023 16:20:50 -------------------------------------------------------------------------------- Encounter Discharge Information Details Patient Name: Date of Service: Barry Wood. 03/22/2023 12:45 PM Medical Record Number: [id number] Patient Account Number: [id number] Date of Birth/Sex: Treating RN: 04/27/1971 (52 y.o. [name] Primary Care Farouk Vivero: Jiles Prows Other Clinician: Referring Moncia Annas: Treating  Zayra Devito/Extender: Darci Current in Treatment: 0 Encounter Discharge Information Items Discharge Condition: Stable Ambulatory Status: Ambulatory Discharge Destination: Home Transportation: Private Auto Accompanied By: self Schedule Follow-up Appointment: Yes Clinical Summary of Care: Patient Declined Electronic Signature(s) Signed: 03/22/2023 5:03:07 PM By: Redmond Pulling RN, BSN Entered By: Redmond Pulling on 03/22/2023 16:18:50 -------------------------------------------------------------------------------- Lower Extremity Assessment Details Patient Name: Date of Service: Barry Wood, Barry Wood. 03/22/2023 12:45 PM Medical Record Number: [id number] Patient Account Number: [id number] Date of Birth/Sex: Treating RN: July 20, 1970 (52 y.o. [name] Primary Care Payam Gribble: Jiles Prows Other Clinician: Referring Naava Janeway: Treating Kaela Beitz/Extender: Darci Current in Treatment: 0 Edema Assessment Assessed: Kyra Searles: No] Franne Forts: No] [Left: Edema] Franne Forts: :] J[LeftClaire Shown ([id number])] [YPPJK: [id number] Page 4 of 16] Calf Left: Right: Point of Measurement: From Medial Instep 37 cm 37 cm Ankle Left: Right: Point of Measurement: From Medial Instep 25 cm 24 cm Vascular Assessment Pulses: Dorsalis Pedis Palpable: [Left:Yes] [Right:Yes] Extremity colors, hair growth, and conditions: Extremity Color: [Left:Normal] [Right:Normal] Hair Growth on Extremity: [Left:Yes] [Right:Yes] Temperature of Extremity: [Left:Warm] [Right:Warm] Capillary Refill: [Left:< 3 seconds] [Right:< 3 seconds] Blood Pressure: Brachial: [Left:118] [Right:118] Ankle: [Left:Dorsalis Pedis: 146 1.24] [Right:Dorsalis Pedis: 154 1.31] Electronic Signature(s) Signed: 03/22/2023 5:03:07 PM By: Redmond Pulling RN, BSN Entered By: Redmond Pulling on 03/22/2023  13:18:34 -------------------------------------------------------------------------------- Multi Wound Chart Details Patient Name: Date of Service: Barry Wood. 03/22/2023 12:45 PM Medical Record Number: [id number] Patient Account Number: [id number] Date of Birth/Sex: Treating RN: 11/26/1970 (52 y.o. M) Primary Care Britteny Fiebelkorn: Jiles Prows Other Clinician: Referring Samone Guhl: Treating Rateel Beldin/Extender: Darci Current in Treatment: 0 Vital Signs Height(in): Pulse(bpm): 64 Weight(lbs): Blood Pressure(mmHg): 118/79 Body Mass Index(BMI): Temperature(F): 98.1 Respiratory Rate(breaths/min): 18 [1:Photos:] [3:No Photos] Right Foot Right, Proximal, Plantar Foot Left, Plantar Foot Wound Location: Gradually Appeared Gradually Appeared Gradually Appeared Wounding Event: T be determined o T be determined o T be determined o Primary Etiology: 02/20/2023 02/20/2023 02/20/2023 Date Acquired: 0 0 0 Weeks of Treatment: Open Open Open Wound Status:  No No No Wound Recurrence: Yes No Yes Clustered Wound: 5 N/A 3 Clustered Quantity: 1x7x0.1 0.3x7x0.1 1x9x0.1 Measurements L x W x D (cm) 5.498 1.649 7.069 A (cm) : rea 0.55 0.165 0.707 Volume (cm) : Barry Wood, Barry Wood ([id number]) [id number] Page 5 of 16 Full Thickness Without Exposed Full Thickness Without Exposed Full Thickness Without Exposed Classification: Support Structures Support Structures Support Structures Medium Medium Medium Exudate A mount: Serosanguineous Serosanguineous Serosanguineous Exudate Type: red, brown red, brown red, brown Exudate Color: Medium (34-66%) Small (1-33%) Large (67-100%) Granulation Amount: Red Pink Red Granulation Quality: Medium (34-66%) Large (67-100%) Small (1-33%) Necrotic Amount: Eschar, Adherent Slough Eschar, Adherent Slough Eschar, Adherent Slough Necrotic Tissue: Fat Layer (Subcutaneous Tissue): Yes Fat Layer (Subcutaneous Tissue):  Yes Fat Layer (Subcutaneous Tissue): Yes Exposed Structures: Fascia: No Fascia: No Fascia: No Tendon: No Tendon: No Tendon: No Muscle: No Muscle: No Muscle: No Joint: No Joint: No Joint: No Bone: No Bone: No Bone: No None None None Epithelialization: Callus: Yes Periwound Skin Texture: Wound Number: [id number]  Photos: Right, Dorsal Foot Left, Dorsal Foot Right Hand - 2nd Digit Wound Location: Gradually Appeared Gradually Appeared Gradually Appeared Wounding Event: T be determined o T be determined o T be determined o Primary Etiology: 02/20/2023 02/20/2023 02/20/2023 Date Acquired: 0 0 0 Weeks of Treatment: Open Open Open Wound Status: No No No Wound Recurrence: Yes No No Clustered Wound: 2 N/A N/A Clustered Quantity: 1x2.5x0.1 0.3x0.3x0.1 0.3x1x0.1 Measurements L x W x D (cm) 1.963 0.071 0.236 A (cm) : rea 0.196 0.007 0.024 Volume (cm) : Full Thickness Without Exposed Full Thickness Without Exposed Full Thickness Without Exposed Classification: Support Structures Support Structures Support Structures Medium Medium Medium Exudate A mount: Serosanguineous Serosanguineous Serosanguineous Exudate Type: red, brown red, brown red, brown Exudate Color: Large (67-100%) Large (67-100%) Medium (34-66%) Granulation Amount: Red Red Red Granulation Quality: Small (1-33%) Small (1-33%) Medium (34-66%) Necrotic Amount: Eschar, Adherent Slough Adherent [employer], Adherent Slough Necrotic Tissue: Fat Layer (Subcutaneous Tissue): Yes Fat Layer (Subcutaneous Tissue): Yes Fat Layer (Subcutaneous Tissue): Yes Exposed Structures: Fascia: No Fascia: No Fascia: No Tendon: No Tendon: No Tendon: No Muscle: No Muscle: No Muscle: No Joint: No Joint: No Joint: No Bone: No Bone: No Bone: No None None None Epithelialization: Treatment Notes Wound #1 (Foot) Wound Laterality: Right Cleanser Soap and Water Discharge Instruction: May shower and wash wound with  dial antibacterial soap and water prior to dressing change. Peri-Wound Care Triamcinolone 15 (g) Discharge Instruction: Use triamcinolone 15 (g) as directed Topical Triamcinolone Discharge Instruction: Apply Triamcinolone as directed Primary Dressing Maxorb Extra CMC/Alginate Dressing, 4x4 (in/in) Discharge Instruction: Apply to wound bed as instructed Secondary Dressing Woven Gauze Sponge, Non-Sterile 4x4 in Discharge Instruction: Apply over primary dressing as directed. Barry Wood, Barry Wood ([id number]) [id number] Page 6 of 16 Secured With [employer], 4.5x3.1 (in/yd) Discharge Instruction: Secure with Kerlix as directed. Transpore Surgical Tape, 2x10 (in/yd) Discharge Instruction: Secure dressing with tape as directed. Compression Wrap Compression Stockings Add-Ons Wound #2 (Foot) Wound Laterality: Plantar, Right, Proximal Cleanser Soap and Water Discharge Instruction: May shower and wash wound with dial antibacterial soap and water prior to dressing change. Peri-Wound Care Triamcinolone 15 (g) Discharge Instruction: Use triamcinolone 15 (g) as directed Topical Triamcinolone Discharge Instruction: Apply Triamcinolone as directed Primary Dressing Maxorb Extra CMC/Alginate Dressing, 4x4 (in/in) Discharge Instruction: Apply to wound bed as instructed Secondary Dressing Woven Gauze Sponge, Non-Sterile 4x4 in Discharge Instruction: Apply over primary dressing as directed. Secured With [employer],  4.5x3.1 (in/yd) Discharge Instruction: Secure with Kerlix as directed. Transpore Surgical Tape, 2x10 (in/yd) Discharge Instruction: Secure dressing with tape as directed. Compression Wrap Compression Stockings Add-Ons Wound #3 (Foot) Wound Laterality: Plantar, Left Cleanser Soap and Water Discharge Instruction: May shower and wash wound with dial antibacterial soap and water prior to dressing change. Peri-Wound Care Triamcinolone 15  (g) Discharge Instruction: Use triamcinolone 15 (g) as directed Topical Triamcinolone Discharge Instruction: Apply Triamcinolone as directed Primary Dressing Maxorb Extra CMC/Alginate Dressing, 4x4 (in/in) Discharge Instruction: Apply to wound bed as instructed Secondary Dressing Woven Gauze Sponge, Non-Sterile 4x4 in Discharge Instruction: Apply over primary dressing as directed. Secured With [employer], 4.5x3.1 (in/yd) Discharge Instruction: Secure with Kerlix as directed. Transpore Surgical Tape, 2x10 (in/yd) Discharge Instruction: Secure dressing with tape as directed. Barry Wood, Barry Wood ([id number]) [id number] Page 7 of 16 Compression Wrap Compression Stockings Add-Ons Wound #4 (Foot) Wound Laterality: Dorsal, Right Cleanser Soap and Water Discharge Instruction: May shower and wash wound with dial antibacterial soap and water prior to dressing change. Peri-Wound Care Triamcinolone 15 (g) Discharge Instruction: Use triamcinolone 15 (g) as directed Topical Triamcinolone Discharge Instruction: Apply Triamcinolone as directed Primary Dressing Maxorb Extra CMC/Alginate Dressing, 4x4 (in/in) Discharge Instruction: Apply to wound bed as instructed Secondary Dressing Woven Gauze Sponge, Non-Sterile 4x4 in Discharge Instruction: Apply over primary dressing as directed. Secured With [employer], 4.5x3.1 (in/yd) Discharge Instruction: Secure with Kerlix as directed. Transpore Surgical Tape, 2x10 (in/yd) Discharge Instruction: Secure dressing with tape as directed. Compression Wrap Compression Stockings Add-Ons Wound #5 (Foot) Wound Laterality: Dorsal, Left Cleanser Soap and Water Discharge Instruction: May shower and wash wound with dial antibacterial soap and water prior to dressing change. Peri-Wound Care Triamcinolone 15 (g) Discharge Instruction: Use triamcinolone 15 (g) as directed Topical Triamcinolone Discharge  Instruction: Apply Triamcinolone as directed Primary Dressing Maxorb Extra CMC/Alginate Dressing, 4x4 (in/in) Discharge Instruction: Apply to wound bed as instructed Secondary Dressing Woven Gauze Sponge, Non-Sterile 4x4 in Discharge Instruction: Apply over primary dressing as directed. Secured With [employer], 4.5x3.1 (in/yd) Discharge Instruction: Secure with Kerlix as directed. Transpore Surgical Tape, 2x10 (in/yd) Discharge Instruction: Secure dressing with tape as directed. Compression Wrap Compression Stockings Add-Ons Barry Wood, Barry Wood ([id number]) [id number] Page 8 of 16 Wound #6 (Hand - 2nd Digit) Wound Laterality: Right Cleanser Soap and Water Discharge Instruction: May shower and wash wound with dial antibacterial soap and water prior to dressing change. Peri-Wound Care Triamcinolone 15 (g) Discharge Instruction: Use triamcinolone 15 (g) as directed Topical Primary Dressing Maxorb Extra Calcium Alginate, 2x2 (in/in) Discharge Instruction: Apply to wound bed as instructed Secondary Dressing Woven Gauze Sponges 2x2 in Discharge Instruction: Apply over primary dressing as directed. Secured With Transpore Surgical Tape, 2x10 (in/yd) Discharge Instruction: Secure dressing with tape as directed. Compression Wrap Compression Stockings Add-Ons Electronic Signature(s) Signed: 03/22/2023 5:30:40 PM By: Duanne Guess MD FACS Previous Signature: 03/22/2023 1:32:27 PM Version By: Duanne Guess MD FACS Entered By: Duanne Guess on 03/22/2023 17:30:40 -------------------------------------------------------------------------------- Multi-Disciplinary Care Plan Details Patient Name: Date of Service: Barry Wood. 03/22/2023 12:45 PM Medical Record Number: [id number] Patient Account Number: [id number] Date of Birth/Sex: Treating RN: 1970-07-09 (51 y.o. [name] Primary Care Seraiah Nowack: Jiles Prows Other  Clinician: Referring Shilee Biggs: Treating Tishawn Friedhoff/Extender: Darci Current in Treatment: 0 Active Inactive Wound/Skin Impairment Nursing Diagnoses: Impaired tissue integrity Knowledge deficit related to ulceration/compromised skin integrity Goals: Patient/caregiver will verbalize understanding of skin care regimen Date Initiated: 03/22/2023 Target Resolution Date: 04/19/2023 Goal  Status: Active Ulcer/skin breakdown will have a volume reduction of 30% by week 4 Date Initiated: 03/22/2023 Target Resolution Date: 04/19/2023 Goal Status: Active Interventions: Assess patient/caregiver ability to obtain necessary supplies Assess patient/caregiver ability to perform ulcer/skin care regimen upon admission and as needed Assess ulceration(s) every visit Provide education on ulcer and skin care PONCE, GAMARRA Wood ([id number]) [id number] Page 9 of 16 Notes: Electronic Signature(s) Signed: 03/22/2023 5:03:07 PM By: Redmond Pulling RN, BSN Entered By: Redmond Pulling on 03/22/2023 13:54:02 -------------------------------------------------------------------------------- Pain Assessment Details Patient Name: Date of Service: JEBADIAH, BOYDEN Wood. 03/22/2023 12:45 PM Medical Record Number: [id number] Patient Account Number: [id number] Date of Birth/Sex: Treating RN: 1970-09-06 (52 y.o. [name] Primary Care Leone Mobley: Jiles Prows Other Clinician: Referring Reynaldo Rossman: Treating Emilyn Ruble/Extender: Darci Current in Treatment: 0 Active Problems Location of Pain Severity and Description of Pain Patient Has Paino Yes Site Locations Rate the pain. Current Pain Level: 7 Pain Management and Medication Current Pain Management: Electronic Signature(s) Signed: 03/22/2023 5:03:07 PM By: Redmond Pulling RN, BSN Entered By: Redmond Pulling on 03/22/2023  13:33:29 -------------------------------------------------------------------------------- Patient/Caregiver Education Details Patient Name: Date of Service: Kayren Eaves 10/30/2024andnbsp12:45 PM Medical Record Number: [id number] Patient Account Number: [id number] Date of Birth/Gender: Treating RN: 09-15-1970 (53 y.o. [name] Primary Care Physician: Jiles Prows Other Clinician: Referring Physician: Treating Physician/Extender: Darci Current in Treatment: 0 Education Assessment [place], Smithwick Wood ([id number]) [id number] Page 10 of 16 Education Provided To: Patient Education Topics Provided Welcome T [facility]New [facility] Packet: o Methods: Explain/Verbal Responses: State content correctly Wound/Skin Impairment: Methods: Explain/Verbal Responses: State content correctly Electronic Signature(s) Signed: 03/22/2023 5:03:07 PM By: Redmond Pulling RN, BSN Entered By: Redmond Pulling on 03/22/2023 13:54:18 -------------------------------------------------------------------------------- Wound Assessment Details Patient Name: Date of Service: Barry Wood. 03/22/2023 12:45 PM Medical Record Number: [id number] Patient Account Number: [id number] Date of Birth/Sex: Treating RN: [birth year] (52 y.o. [name] Primary Care Tyshika Baldridge: Jiles Prows Other Clinician: Referring Dakhari Zuver: Treating Delyle Weider/Extender: Darci Current in Treatment: 0 Wound Status Wound Number: 1 Primary Etiology: T be determined o Wound Location: Right Foot Wound Status: Open Wounding Event: Gradually Appeared Date Acquired: 02/20/2023 Weeks Of Treatment: 0 Clustered Wound: Yes Photos Wound Measurements Length: (cm) Width: (cm) Depth: (cm) Clustered Quantity: Area: (cm) Volume: (cm) 1 % Reduction in Area: 7 % Reduction in Volume: 0.1 Epithelialization: None 5 Tunneling: No 5.498 Undermining:  No 0.55 Wound Description Classification: Full Thickness Without Exposed Supp Exudate Amount: Medium Exudate Type: Serosanguineous Exudate Color: red, brown ort Structures Foul Odor After Cleansing: No Slough/Fibrino Yes Wound Bed Granulation Amount: Medium (34-66%) Exposed Barry Wood, Barry Wood ([id number]) [id number] Page 11 of 16 Granulation Quality: Red Fascia Exposed: No Necrotic Amount: Medium (34-66%) Fat Layer (Subcutaneous Tissue) Exposed: Yes Necrotic Quality: Eschar, Adherent Slough Tendon Exposed: No Muscle Exposed: No Joint Exposed: No Bone Exposed: No Periwound Skin Texture Texture Color No Abnormalities Noted: No No Abnormalities Noted: No Callus: Yes Moisture No Abnormalities Noted: No Electronic Signature(s) Signed: 03/22/2023 5:03:07 PM By: Redmond Pulling RN, BSN Entered By: Redmond Pulling on 03/22/2023 13:36:39 -------------------------------------------------------------------------------- Wound Assessment Details Patient Name: Date of Service: Barry Wood. 03/22/2023 12:45 PM Medical Record Number: [id number] Patient Account Number: [id number] Date of Birth/Sex: Treating RN: 1970/08/23 (52 y.o. [name] Primary Care Vadie Principato: Jiles Prows Other Clinician: Referring Trasean Delima: Treating Abagale Boulos/Extender: Darci Current in Treatment: 0 Wound Status Wound Number: 2 Primary  Etiology: T be determined o Wound Location: Right, Proximal, Plantar Foot Wound Status: Open Wounding Event: Gradually Appeared Date Acquired: 02/20/2023 Weeks Of Treatment: 0 Clustered Wound: No Photos Wound Measurements Length: (cm) Width: (cm) Depth: (cm) Area: (cm) Volume: (cm) 0.3 % Reduction in Area: 7 % Reduction in Volume: 0.1 Epithelialization: None 1.649 Tunneling: No 0.165 Undermining: No Wound Description Classification: Full Thickness Without Exposed Sup Exudate Amount: Medium Exudate  Type: Serosanguineous Exudate Color: red, brown port Structures Foul Odor After Cleansing: No Slough/Fibrino Yes Wound Bed Granulation Amount: Small (1-33%) Exposed Structure Granulation Quality: Pink Fascia Exposed: No Barry Wood, Barry Wood ([id number]) [id number] Page 12 of 16 Necrotic Amount: Large (67-100%) Fat Layer (Subcutaneous Tissue) Exposed: Yes Necrotic Quality: Eschar, Adherent Slough Tendon Exposed: No Muscle Exposed: No Joint Exposed: No Bone Exposed: No Periwound Skin Texture Texture Color No Abnormalities Noted: No No Abnormalities Noted: No Moisture No Abnormalities Noted: No Electronic Signature(s) Signed: 03/22/2023 5:03:07 PM By: Redmond Pulling RN, BSN Entered By: Redmond Pulling on 03/22/2023 13:37:11 -------------------------------------------------------------------------------- Wound Assessment Details Patient Name: Date of Service: Barry Wood. 03/22/2023 12:45 PM Medical Record Number: [id number] Patient Account Number: [id number] Date of Birth/Sex: Treating RN: 11/06/1970 (52 y.o. [name] Primary Care Lillard Bailon: Jiles Prows Other Clinician: Referring Kiah Keay: Treating Margeaux Swantek/Extender: Darci Current in Treatment: 0 Wound Status Wound Number: 3 Primary Etiology: T be determined o Wound Location: Left, Plantar Foot Wound Status: Open Wounding Event: Gradually Appeared Date Acquired: 02/20/2023 Weeks Of Treatment: 0 Clustered Wound: Yes Wound Measurements Length: (cm) Width: (cm) Depth: (cm) Clustered Quantity: Area: (cm) Volume: (cm) 1 % Reduction in Area: 9 % Reduction in Volume: 0.1 Epithelialization: None 3 Tunneling: No 7.069 Undermining: No 0.707 Wound Description Classification: Full Thickness Without Exposed Supp Exudate Amount: Medium Exudate Type: Serosanguineous Exudate Color: red, brown ort Structures Foul Odor After Cleansing: No Slough/Fibrino Yes Wound  Bed Granulation Amount: Large (67-100%) Exposed Structure Granulation Quality: Red Fascia Exposed: No Necrotic Amount: Small (1-33%) Fat Layer (Subcutaneous Tissue) Exposed: Yes Necrotic Quality: Eschar, Adherent Slough Tendon Exposed: No Muscle Exposed: No Joint Exposed: No Bone Exposed: No Periwound Skin Texture Texture Color No Abnormalities Noted: No No Abnormalities Noted: No Moisture No Abnormalities Noted: No Electronic Signature(sMARQUESE, RISING Wood ([id number]) [id number] Page 13 of 16 Signed: 03/22/2023 5:03:07 PM By: Redmond Pulling RN, BSN Entered By: Redmond Pulling on 03/22/2023 13:37:40 -------------------------------------------------------------------------------- Wound Assessment Details Patient Name: Date of Service: MASAAKI, STURGELL 03/22/2023 12:45 PM Medical Record Number: [id number] Patient Account Number: [id number] Date of Birth/Sex: Treating RN: 02/11/1971 (52 y.o. [name] Primary Care Jamespaul Secrist: Jiles Prows Other Clinician: Referring Kaydense Rizo: Treating Marthann Abshier/Extender: Darci Current in Treatment: 0 Wound Status Wound Number: 4 Primary Etiology: T be determined o Wound Location: Right, Dorsal Foot Wound Status: Open Wounding Event: Gradually Appeared Date Acquired: 02/20/2023 Weeks Of Treatment: 0 Clustered Wound: Yes Photos Wound Measurements Length: (cm) Width: (cm) Depth: (cm) Clustered Quantity: Area: (cm) Volume: (cm) 1 % Reduction in Area: 2.5 % Reduction in Volume: 0.1 Epithelialization: None 2 Tunneling: No 1.963 Undermining: No 0.196 Wound Description Classification: Full Thickness Without Exposed Supp Exudate Amount: Medium Exudate Type: Serosanguineous Exudate Color: red, brown ort Structures Foul Odor After Cleansing: No Slough/Fibrino Yes Wound Bed Granulation Amount: Large (67-100%) Exposed Structure Granulation Quality: Red Fascia Exposed: No Necrotic  Amount: Small (1-33%) Fat Layer (Subcutaneous Tissue) Exposed: Yes Necrotic Quality: Eschar, Adherent Slough Tendon Exposed: No Muscle Exposed: No Joint Exposed: No Bone  Exposed: No Periwound Skin Texture Texture Color No Abnormalities Noted: No No Abnormalities Noted: No Moisture No Abnormalities Noted: No Electronic Signature(s) Signed: 03/22/2023 5:03:07 PM By: Redmond Pulling RN, BSN Colerain, Watchung Wood [phone] By: Redmond Pulling RN, BSN [phone].pdf Page 14 of 16 Signed: 03/22/2023 5:03:07 Entered By: Redmond Pulling on 03/22/2023 13:38:08 -------------------------------------------------------------------------------- Wound Assessment Details Patient Name: Date of Service: DUWAINE, JASMIN 03/22/2023 12:45 PM Medical Record Number: [id number] Patient Account Number: [id number] Date of Birth/Sex: Treating RN: Nov 16, 1970 (52 y.o. [name] Primary Care Hervey Wedig: Jiles Prows Other Clinician: Referring Annalise Mcdiarmid: Treating Ernst Cumpston/Extender: Darci Current in Treatment: 0 Wound Status Wound Number: 5 Primary Etiology: T be determined o Wound Location: Left, Dorsal Foot Wound Status: Open Wounding Event: Gradually Appeared Date Acquired: 02/20/2023 Weeks Of Treatment: 0 Clustered Wound: No Photos Wound Measurements Length: (cm) 0.3 Width: (cm) 0.3 Depth: (cm) 0.1 Area: (cm) 0.071 Volume: (cm) 0.007 % Reduction in Area: % Reduction in Volume: Epithelialization: None Tunneling: No Undermining: No Wound Description Classification: Full Thickness Without Exposed Suppor Exudate Amount: Medium Exudate Type: Serosanguineous Exudate Color: red, brown t Structures Foul Odor After Cleansing: No Slough/Fibrino Yes Wound Bed Granulation Amount: Large (67-100%) Exposed Structure Granulation Quality: Red Fascia Exposed: No Necrotic Amount: Small (1-33%) Fat Layer (Subcutaneous Tissue) Exposed: Yes Necrotic  Quality: Adherent Slough Tendon Exposed: No Muscle Exposed: No Joint Exposed: No Bone Exposed: No Periwound Skin Texture Texture Color No Abnormalities Noted: No No Abnormalities Noted: No Moisture No Abnormalities Noted: No Electronic Signature(s) Signed: 03/22/2023 5:03:07 PM By: Redmond Pulling RN, BSN Entered By: Redmond Pulling on 03/22/2023 13:38:36 Kayren Eaves Wood ([id number]) [id number].pdf Page 15 of 16 -------------------------------------------------------------------------------- Wound Assessment Details Patient Name: Date of Service: DARLYN, BOLEJACK 03/22/2023 12:45 PM Medical Record Number: [id number] Patient Account Number: [id number] Date of Birth/Sex: Treating RN: December 20, 1970 (52 y.o. [name] Primary Care Fenna Semel: Jiles Prows Other Clinician: Referring Nakayla Rorabaugh: Treating Lexi Conaty/Extender: Darci Current in Treatment: 0 Wound Status Wound Number: 6 Primary Etiology: T be determined o Wound Location: Right Hand - 2nd Digit Wound Status: Open Wounding Event: Gradually Appeared Date Acquired: 02/20/2023 Weeks Of Treatment: 0 Clustered Wound: No Photos Wound Measurements Length: (cm) 0.3 Width: (cm) 1 Depth: (cm) 0.1 Area: (cm) 0.236 Volume: (cm) 0.024 % Reduction in Area: % Reduction in Volume: Epithelialization: None Tunneling: No Undermining: No Wound Description Classification: Full Thickness Without Exposed Support Exudate Amount: Medium Exudate Type: Serosanguineous Exudate Color: red, brown Structures Foul Odor After Cleansing: No Slough/Fibrino Yes Wound Bed Granulation Amount: Medium (34-66%) Exposed Structure Granulation Quality: Red Fascia Exposed: No Necrotic Amount: Medium (34-66%) Fat Layer (Subcutaneous Tissue) Exposed: Yes Necrotic Quality: Eschar, Adherent Slough Tendon Exposed: No Muscle Exposed: No Joint Exposed: No Bone Exposed: No Periwound Skin Texture Texture  Color No Abnormalities Noted: No No Abnormalities Noted: No Moisture No Abnormalities Noted: No Electronic Signature(s) Signed: 03/22/2023 5:03:07 PM By: Redmond Pulling RN, BSN Entered By: Redmond Pulling on 03/22/2023 13:40:26 Kayren Eaves Wood ([id number]) [id number] Page 16 of 16 -------------------------------------------------------------------------------- Vitals Details Patient Name: Date of Service: BAIDEN, LYNES 03/22/2023 12:45 PM Medical Record Number: [id number] Patient Account Number: [id number] Date of Birth/Sex: Treating RN: 1970/12/28 (51 y.o. [name] Primary Care Blu Mcglaun: Jiles Prows Other Clinician: Referring Garwood Wentzell: Treating Daiton Cowles/Extender: Darci Current in Treatment: 0 Vital Signs Time Taken: 12:57 Temperature (F): 98.1 Pulse (bpm): 64 Respiratory Rate (breaths/min): 18 Blood Pressure (mmHg): 118/79 Reference Range: 80 -  120 mg / dl Electronic Signature(s) Signed: 03/22/2023 5:03:07 PM By: Redmond Pulling RN, BSN Entered By: Redmond Pulling on 03/22/2023 12:57:52

## 2023-03-23 NOTE — Progress Notes (Signed)
Barry Wood, Barry Wood ([id number]) [id number] Page 1 of 4 Visit Report for 03/22/2023 Abuse Risk Screen Details Patient Name: Date of Service: Barry Wood, Barry Wood 03/22/2023 12:45 PM Medical Record Number: [id number] Patient Account Number: [id number] Date of Birth/Sex: Treating RN: 13-Jun-1970 (52 y.o. Barry Wood Primary Care Barry Wood: Barry Wood Other Clinician: Referring Barry Wood: Treating Barry Wood/Extender: Barry Wood in Treatment: 0 Abuse Risk Screen Items Answer ABUSE RISK SCREEN: Has anyone close to you tried to hurt or harm you recentlyo No Do you feel uncomfortable with anyone in your familyo No Has anyone forced you do things that you didnt want to doo No Electronic Signature(s) Signed: 03/22/2023 5:03:07 PM By: Redmond Pulling RN, BSN Entered By: Redmond Pulling on 03/22/2023 13:06:47 -------------------------------------------------------------------------------- Activities of Daily Living Details Patient Name: Date of Service: Barry Wood, Barry Wood 03/22/2023 12:45 PM Medical Record Number: [id number] Patient Account Number: [id number] Date of Birth/Sex: Treating RN: 07/03/1970 (52 y.o. Barry Wood Primary Care Barry Wood: Barry Wood Other Clinician: Referring Barry Wood: Treating Barry Wood/Extender: Barry Wood in Treatment: 0 Activities of Daily Living Items Answer Activities of Daily Living (Please select one for each item) Drive Automobile Completely Able T Medications ake Completely Able Use T elephone Completely Able Care for Appearance Completely Able Use T oilet Completely Able Bath / Shower Completely Able Dress Self Completely Able Feed Self Completely Able Walk Completely Able Get In / Out Bed Completely Able Housework Completely Able Prepare Meals Completely Able Handle Money Completely Able Shop for Self Completely Able Electronic Signature(s) Signed:  03/22/2023 5:03:07 PM By: Redmond Pulling RN, BSN Entered By: Redmond Pulling on 03/22/2023 13:07:12 Barry Wood Wood ([id number]) [id number] Page 2 of 4 -------------------------------------------------------------------------------- Education Screening Details Patient Name: Date of Service: Barry Wood, Barry Wood 03/22/2023 12:45 PM Medical Record Number: [id number] Patient Account Number: [id number] Date of Birth/Sex: Treating RN: 12/21/1970 (52 y.o. Barry Wood Primary Care Barry Wood: Barry Wood Other Clinician: Referring Barry Wood: Treating Barry Wood/Extender: Barry Wood in Treatment: 0 Primary Learner Assessed: Patient Learning Preferences/Education Level/Primary Language Learning Preference: Explanation Preferred Language: English Cognitive Barrier Language Barrier: No Translator Needed: No Memory Deficit: No Emotional Barrier: No Cultural/Religious Beliefs Affecting Medical Care: No Physical Barrier Impaired Vision: No Impaired Hearing: No Decreased Hand dexterity: No Knowledge/Comprehension Knowledge Level: High Comprehension Level: High Ability to understand written instructions: High Ability to understand verbal instructions: High Motivation Anxiety Level: Calm Cooperation: Cooperative Education Importance: Acknowledges Need Interest in Health Problems: Asks Questions Perception: Coherent Willingness to Engage in Self-Management High Activities: Readiness to Engage in Self-Management High Activities: Electronic Signature(s) Signed: 03/22/2023 5:03:07 PM By: Redmond Pulling RN, BSN Entered By: Redmond Pulling on 03/22/2023 13:07:38 -------------------------------------------------------------------------------- Fall Risk Assessment Details Patient Name: Date of Service: Barry Ready Wood. 03/22/2023 12:45 PM Medical Record Number: [id number] Patient Account Number: [id number] Date of Birth/Sex:  Treating RN: [birth year] (52 y.o. Barry Wood Primary Care Barry Wood: Barry Wood Other Clinician: Referring Barry Wood: Treating Barry Wood/Extender: Barry Wood in Treatment: 0 Fall Risk Assessment Items Have you had 2 or more falls in the last 12 monthso 0 No Have you had any fall that resulted in injury in the last 12 monthso 0 No Barry Wood, Barry Wood ([id number]) [id number] Page 3 of 4 FALLS RISK SCREEN History of falling - immediate or within 3 months 0 No Secondary diagnosis (Do you have 2 or more medical diagnoseso) 0 No Ambulatory aid None/bed rest/wheelchair/nurse 0 Yes Crutches/cane/walker  0 No Furniture 0 No Intravenous therapy Access/Saline/Heparin Lock 0 No Gait/Transferring Normal/ bed rest/ wheelchair 0 Yes Weak (short steps with or without shuffle, stooped but able to lift head while walking, may seek 0 No support from furniture) Impaired (short steps with shuffle, may have difficulty arising from chair, head down, impaired 0 No balance) Mental Status Oriented to own ability 0 Yes Electronic Signature(s) Signed: 03/22/2023 5:03:07 PM By: Redmond Pulling RN, BSN Entered By: Redmond Pulling on 03/22/2023 13:08:31 -------------------------------------------------------------------------------- Foot Assessment Details Patient Name: Date of Service: Barry Ready Wood. 03/22/2023 12:45 PM Medical Record Number: [id number] Patient Account Number: [id number] Date of Birth/Sex: Treating RN: 1970-11-17 (52 y.o. Barry Wood Primary Care Barry Wood: Barry Wood Other Clinician: Referring Barry Wood: Treating Barry Wood/Extender: Barry Wood in Treatment: 0 Foot Assessment Items Site Locations + = Sensation present, - = Sensation absent, C = Callus, U = Ulcer Wood = Redness, W = Warmth, M = Maceration, PU = Pre-ulcerative lesion F = Fissure, S = Swelling, D = Dryness Assessment Right:  Left: Other Deformity: No No Prior Foot Ulcer: No No Prior Amputation: No No Charcot Joint: No No Ambulatory Status: Ambulatory Without Help GaitVERTIS, Barry Wood ([id number]) [phone].pdf Page 4 of 4 Electronic Signature(s) Signed: 03/22/2023 5:03:07 PM By: Redmond Pulling RN, BSN Entered By: Redmond Pulling on 03/22/2023 13:34:50 -------------------------------------------------------------------------------- Nutrition Risk Screening Details Patient Name: Date of Service: Barry Wood, Barry Wood 03/22/2023 12:45 PM Medical Record Number: [id number] Patient Account Number: [id number] Date of Birth/Sex: Treating RN: [birth year] (52 y.o. Barry Wood Primary Care Bailea Beed: Barry Wood Other Clinician: Referring Autum Benfer: Treating Tauno Falotico/Extender: Barry Wood in Treatment: 0 Height (in): Weight (lbs): Body Mass Index (BMI): Nutrition Risk Screening Items Score Screening NUTRITION RISK SCREEN: I have an illness or condition that made me change the kind and/or amount of food I eat 0 No I eat fewer than two meals per day 0 No I eat few fruits and vegetables, or milk products 0 No I have three or more drinks of beer, liquor or wine almost every day 0 No I have tooth or mouth problems that make it hard for me to eat 0 No I don't always have enough money to buy the food I need 0 No I eat alone most of the time 0 No I take three or more different prescribed or over-the-counter drugs a day 0 No Without wanting to, I have lost or gained 10 pounds in the last six months 0 No I am not always physically able to shop, cook and/or feed myself 0 No Nutrition Protocols Good Risk Protocol Moderate Risk Protocol High Risk Proctocol Risk Level: Good Risk Score: 0 Electronic Signature(s) Signed: 03/22/2023 5:03:07 PM By: Redmond Pulling RN, BSN Entered By: Redmond Pulling on 03/22/2023 13:08:39

## 2023-03-23 NOTE — Progress Notes (Signed)
MORRIS, BERGSCHNEIDER R ([id number]) [id number] Page 1 of 11 Visit Report for 03/22/2023 Chief Complaint Document Details Patient Name: Date of Service: Barry Wood, Barry Wood 03/22/2023 12:45 PM Medical Record Number: [id number] Patient Account Number: [id number] Date of Birth/Sex: Treating RN: October 21, 1970 (52 y.o. M) Primary Care Provider: Jiles Prows Other Clinician: Referring Provider: Treating Provider/Extender: Darci Current in Treatment: 0 Information Obtained from: Patient Chief Complaint Patient seen for complaints of Non-Healing Wounds. Electronic Signature(s) Signed: 03/22/2023 5:30:48 PM By: Duanne Guess MD FACS Previous Signature: 03/22/2023 1:32:44 PM Version By: Duanne Guess MD FACS Entered By: Duanne Guess on 03/22/2023 17:30:47 -------------------------------------------------------------------------------- HPI Details Patient Name: Date of Service: Meryle Ready R. 03/22/2023 12:45 PM Medical Record Number: [id number] Patient Account Number: [id number] Date of Birth/Sex: Treating RN: 02-Oct-1970 (52 y.o. M) Primary Care Provider: Jiles Prows Other Clinician: Referring Provider: Treating Provider/Extender: Darci Current in Treatment: 0 History of Present Illness HPI Description: ADMISSION 03/22/2023 ***ABIs R: 1.31; L: 1.24*** This is a 52 year old otherwise healthy man who presented to the emergency department at the beginning of October with a 2-week history of a desquamating rash to his hands and feet. He was appropriately tested for HIV and syphilis, both of which were negative. He was advised to follow-up and given a prescription for doxycycline 100 bacterial infection as well as gel to apply as needed. He saw his PCP about a week later. At that point, the skin was thickening and peeling off, leaving cracks. The thought in the PCPs office was that this was related to onychomycosis and  the patient was started on oral terbinafine. They apparently also swabbed an open area and cultures returned positive with Prevotella, Staph aureus, and Pseudomonas. He was prescribed ciprofloxacin and rather than referring to dermatology, he was referred to wound care center for further evaluation and management. He says the palms of his hands and soles of his feet are painful and somewhat itchy. Electronic Signature(s) Signed: 03/22/2023 5:35:38 PM By: Duanne Guess MD FACS Previous Signature: 03/22/2023 1:38:50 PM Version By: Duanne Guess MD FACS Entered By: Duanne Guess on 03/22/2023 17:35:37 Physical Exam Details -------------------------------------------------------------------------------- Riley Churches ([id number]) [id number] Page 2 of 11 Patient Name: Date of Service: STEVON, BROWNSON 03/22/2023 12:45 PM Medical Record Number: [id number] Patient Account Number: [id number] Date of Birth/Sex: Treating RN: 1970-10-21 (52 y.o. M) Primary Care Provider: Jiles Prows Other Clinician: Referring Provider: Treating Provider/Extender: Darci Current in Treatment: 0 Constitutional . . . . No acute distress. Respiratory Normal work of breathing on room air. Notes 03/22/2023: The skin on the palms of his hands is cracked and shiny in appearance. The only open area is on his right index finger with a superficial fissure in the skin. His feet appear to have tiny fissures between the toes with areas of recently peeled skin on the dorsal surface and plantar metatarsal head surfaces. He has thickened, hard, almost leathery skin on the midfoot and heel bilaterally. No obvious open wounds. Electronic Signature(s) Signed: 03/22/2023 5:39:06 PM By: Duanne Guess MD FACS Entered By: Duanne Guess on 03/22/2023 17:39:06 -------------------------------------------------------------------------------- Physician Orders  Details Patient Name: Date of Service: Meryle Ready R. 03/22/2023 12:45 PM Medical Record Number: [id number] Patient Account Number: [id number] Date of Birth/Sex: Treating RN: 1971-03-23 (52 y.o. [name] Primary Care Provider: Jiles Prows Other Clinician: Referring Provider: Treating Provider/Extender: Darci Current in Treatment: 0 Verbal / Phone Orders: No Diagnosis Coding  ICD-10 Coding Code Description R23.4 Changes in skin texture L98.8 Other specified disorders of the skin and subcutaneous tissue Follow-up Appointments ppointment in 2 weeks. - Dr Lady Gary Wednesday 04/05/23 @ 7:45am Return A Anesthetic Wound #1 Right Foot (In clinic) Topical Lidocaine 5% applied to wound bed Wound #2 Right,Proximal,Plantar Foot (In clinic) Topical Lidocaine 5% applied to wound bed Wound #3 Left,Plantar Foot (In clinic) Topical Lidocaine 5% applied to wound bed Wound #4 Right,Dorsal Foot (In clinic) Topical Lidocaine 5% applied to wound bed Wound #5 Left,Dorsal Foot (In clinic) Topical Lidocaine 5% applied to wound bed Wound #6 Right Hand - 2nd Digit (In clinic) Topical Lidocaine 5% applied to wound bed Bathing/ Shower/ Hygiene May shower and wash wound with soap and water. Wound Treatment Wound #1 - Foot Wound Laterality: Right Cleanser: Soap and Water 1 x Per Day/Other Discharge Instructions: May shower and wash wound with dial antibacterial soap and water prior to dressing change. NIAL, DERRINGTON R ([id number]) [id number] Page 3 of 11 Peri-Wound Care: Triamcinolone 15 (g) 1 x Per Day/Other Discharge Instructions: Use triamcinolone 15 (g) as directed Topical: Triamcinolone 1 x Per Day/Other Discharge Instructions: Apply Triamcinolone as directed Prim Dressing: Maxorb Extra CMC/Alginate Dressing, 4x4 (in/in) 1 x Per Day/Other ary Discharge Instructions: Apply to wound bed as instructed Secondary Dressing: Woven Gauze  Sponge, Non-Sterile 4x4 in 1 x Per Day/Other Discharge Instructions: Apply over primary dressing as directed. Secured With: [employer], 4.5x3.1 (in/yd) 1 x Per Day/Other Discharge Instructions: Secure with Kerlix as directed. Secured With: Transpore Surgical Tape, 2x10 (in/yd) 1 x Per Day/Other Discharge Instructions: Secure dressing with tape as directed. Wound #2 - Foot Wound Laterality: Plantar, Right, Proximal Cleanser: Soap and Water Discharge Instructions: May shower and wash wound with dial antibacterial soap and water prior to dressing change. Peri-Wound Care: Triamcinolone 15 (g) Discharge Instructions: Use triamcinolone 15 (g) as directed Topical: Triamcinolone Discharge Instructions: Apply Triamcinolone as directed Prim Dressing: Maxorb Extra CMC/Alginate Dressing, 4x4 (in/in) ary Discharge Instructions: Apply to wound bed as instructed Secondary Dressing: Woven Gauze Sponge, Non-Sterile 4x4 in Discharge Instructions: Apply over primary dressing as directed. Secured With: [employer], 4.5x3.1 (in/yd) Discharge Instructions: Secure with Kerlix as directed. Secured With: Transpore Surgical Tape, 2x10 (in/yd) Discharge Instructions: Secure dressing with tape as directed. Wound #3 - Foot Wound Laterality: Plantar, Left Cleanser: Soap and Water Discharge Instructions: May shower and wash wound with dial antibacterial soap and water prior to dressing change. Peri-Wound Care: Triamcinolone 15 (g) Discharge Instructions: Use triamcinolone 15 (g) as directed Topical: Triamcinolone Discharge Instructions: Apply Triamcinolone as directed Prim Dressing: Maxorb Extra CMC/Alginate Dressing, 4x4 (in/in) ary Discharge Instructions: Apply to wound bed as instructed Secondary Dressing: Woven Gauze Sponge, Non-Sterile 4x4 in Discharge Instructions: Apply over primary dressing as directed. Secured With: [employer], 4.5x3.1 (in/yd) Discharge Instructions: Secure  with Kerlix as directed. Secured With: Transpore Surgical Tape, 2x10 (in/yd) Discharge Instructions: Secure dressing with tape as directed. Wound #4 - Foot Wound Laterality: Dorsal, Right Cleanser: Soap and Water Discharge Instructions: May shower and wash wound with dial antibacterial soap and water prior to dressing change. Peri-Wound Care: Triamcinolone 15 (g) Discharge Instructions: Use triamcinolone 15 (g) as directed Topical: Triamcinolone Discharge Instructions: Apply Triamcinolone as directed Prim Dressing: Maxorb Extra CMC/Alginate Dressing, 4x4 (in/in) ary Discharge Instructions: Apply to wound bed as instructed YANZIEL, BRAUNSTEIN ([id number]) [phone].pdf Page 4 of 11 Secondary Dressing: Woven Gauze Sponge, Non-Sterile 4x4 in Discharge Instructions: Apply over primary dressing as directed. Secured With: [employer]  Roll Sterile, 4.5x3.1 (in/yd) Discharge Instructions: Secure with Kerlix as directed. Secured With: Transpore Surgical Tape, 2x10 (in/yd) Discharge Instructions: Secure dressing with tape as directed. Wound #5 - Foot Wound Laterality: Dorsal, Left Cleanser: Soap and Water Discharge Instructions: May shower and wash wound with dial antibacterial soap and water prior to dressing change. Peri-Wound Care: Triamcinolone 15 (g) Discharge Instructions: Use triamcinolone 15 (g) as directed Topical: Triamcinolone Discharge Instructions: Apply Triamcinolone as directed Prim Dressing: Maxorb Extra CMC/Alginate Dressing, 4x4 (in/in) ary Discharge Instructions: Apply to wound bed as instructed Secondary Dressing: Woven Gauze Sponge, Non-Sterile 4x4 in Discharge Instructions: Apply over primary dressing as directed. Secured With: [employer], 4.5x3.1 (in/yd) Discharge Instructions: Secure with Kerlix as directed. Secured With: Transpore Surgical Tape, 2x10 (in/yd) Discharge Instructions: Secure dressing with tape as directed. Wound #6 - Hand -  2nd Digit Wound Laterality: Right Cleanser: Soap and Water 1 x Per Day/30 Days Discharge Instructions: May shower and wash wound with dial antibacterial soap and water prior to dressing change. Peri-Wound Care: Triamcinolone 15 (g) 1 x Per Day/30 Days Discharge Instructions: Use triamcinolone 15 (g) as directed Prim Dressing: Maxorb Extra Calcium Alginate, 2x2 (in/in) 1 x Per Day/30 Days ary Discharge Instructions: Apply to wound bed as instructed Secondary Dressing: Woven Gauze Sponges 2x2 in 1 x Per Day/30 Days Discharge Instructions: Apply over primary dressing as directed. Secured With: Transpore Surgical Tape, 2x10 (in/yd) 1 x Per Day/30 Days Discharge Instructions: Secure dressing with tape as directed. Consults Dermatology - [facility] for evaluation and treatment of desquamating rash of bilateral hands and feet Patient Medications llergies: Valium A Notifications Medication Indication Start End 03/22/2023 clobetasol DOSE topical 0.05 % cream - Apply a thin layer to bilateral palms and soles daily Electronic Signature(s) Signed: 03/22/2023 5:50:32 PM By: Duanne Guess MD FACS Previous Signature: 03/22/2023 5:41:17 PM Version By: Duanne Guess MD FACS Previous Signature: 03/22/2023 5:03:07 PM Version By: Redmond Pulling RN, BSN Entered By: Duanne Guess on 03/22/2023 17:42:15 TSUGIO, GLEN R ([id number]) [id number] Page 5 of 11 Prescription 03/22/2023 -------------------------------------------------------------------------------- Kayren Eaves R. Duanne Guess MD Patient Name: Provider: 05-Apr-1971 [id number] Date of Birth: NPI#: Judie Petit [id number] Sex: DEA #: [id number] Phone #: License #: UPN: Patient Address: [address] Eligha Bridegroom [facility] [place], [place] [address] [address] [place], [place] [address] [phone] Allergies Valium Provider's  Orders Dermatology - [facility] for evaluation and treatment of desquamating rash of bilateral hands and feet Hand Signature: Date(s): Electronic Signature(s) Signed: 03/22/2023 5:50:10 PM By: Duanne Guess MD FACS Previous Signature: 03/22/2023 5:03:07 PM Version By: Redmond Pulling RN, BSN Entered By: Duanne Guess on 03/22/2023 17:50:10 -------------------------------------------------------------------------------- Problem List Details Patient Name: Date of Service: Meryle Ready R. 03/22/2023 12:45 PM Medical Record Number: [id number] Patient Account Number: [id number] Date of Birth/Sex: Treating RN: 09-07-1970 (52 y.o. M) Primary Care Provider: Jiles Prows Other Clinician: Referring Provider: Treating Provider/Extender: Darci Current in Treatment: 0 Active Problems ICD-10 Encounter Code Description Active Date MDM Diagnosis R23.4 Changes in skin texture 03/22/2023 No Yes L98.8 Other specified disorders of the skin and subcutaneous tissue 03/22/2023 No Yes Inactive Problems Resolved Problems Electronic Signature(s) Signed: 03/22/2023 5:30:16 PM By: Duanne Guess MD FACS Previous Signature: 03/22/2023 1:30:58 PM Version By: Duanne Guess MD FACS Entered By: Duanne Guess on 03/22/2023 17:30:16 MESIAH, MACAK R ([id number]) [id number] Page 6 of 11 -------------------------------------------------------------------------------- Progress Note Details Patient Name: Date of Service: DIONYSIOS, PFITZER R. 03/22/2023  12:45 PM Medical Record Number: [id number] Patient Account Number: [id number] Date of Birth/Sex: Treating RN: 08-Aug-1970 (52 y.o. M) Primary Care Provider: Jiles Prows Other Clinician: Referring Provider: Treating Provider/Extender: Darci Current in Treatment: 0 Subjective Chief Complaint Information obtained from Patient Patient seen for complaints of Non-Healing  Wounds. History of Present Illness (HPI) ADMISSION 03/22/2023 ***ABIs R: 1.31; L: 1.24*** This is a 52 year old otherwise healthy man who presented to the emergency department at the beginning of October with a 2-week history of a desquamating rash to his hands and feet. He was appropriately tested for HIV and syphilis, both of which were negative. He was advised to follow-up and given a prescription for doxycycline 100 bacterial infection as well as gel to apply as needed. He saw his PCP about a week later. At that point, the skin was thickening and peeling off, leaving cracks. The thought in the PCPs office was that this was related to onychomycosis and the patient was started on oral terbinafine. They apparently also swabbed an open area and cultures returned positive with Prevotella, Staph aureus, and Pseudomonas. He was prescribed ciprofloxacin and rather than referring to dermatology, he was referred to wound care center for further evaluation and management. He says the palms of his hands and soles of his feet are painful and somewhat itchy. Patient History Information obtained from Patient. Allergies Valium Family History Cancer - Mother,Siblings, Diabetes - Father,Mother, Heart Disease - Father, Hypertension - Mother,Father, Kidney Disease - Mother, Seizures - Siblings, Stroke - Siblings, No family history of Lung Disease. Social History Former smoker, Marital Status - Married, Alcohol Use - Daily, Drug Use - Current History - marijuana, Caffeine Use - Daily. Medical A Surgical History Notes nd Endocrine thyroid nodule Review of Systems (ROS) Constitutional Symptoms (General Health) Denies complaints or symptoms of Fatigue, Fever, Chills, Marked Weight Change. Eyes Denies complaints or symptoms of Dry Eyes, Vision Changes, Glasses / Contacts. Ear/Nose/Mouth/Throat Denies complaints or symptoms of Chronic sinus problems or rhinitis. Respiratory Denies complaints or symptoms  of Chronic or frequent coughs, Shortness of Breath. Cardiovascular Denies complaints or symptoms of Chest pain. Gastrointestinal Denies complaints or symptoms of Frequent diarrhea, Nausea, Vomiting. Endocrine Denies complaints or symptoms of Heat/cold intolerance. Genitourinary Denies complaints or symptoms of Frequent urination. Integumentary (Skin) Complains or has symptoms of Wounds - bilateral feet. Musculoskeletal Denies complaints or symptoms of Muscle Pain, Muscle Weakness. Neurologic Denies complaints or symptoms of Numbness/parasthesias. Psychiatric Denies complaints or symptoms of Claustrophobia. RENLEY, BAROUSSE R ([id number]) [id number] Page 7 of 11 Objective Constitutional No acute distress. Vitals Time Taken: 12:57 PM, Temperature: 98.1 F, Pulse: 64 bpm, Respiratory Rate: 18 breaths/min, Blood Pressure: 118/79 mmHg. Respiratory Normal work of breathing on room air. General Notes: 03/22/2023: The skin on the palms of his hands is cracked and shiny in appearance. The only open area is on his right index finger with a superficial fissure in the skin. His feet appear to have tiny fissures between the toes with areas of recently peeled skin on the dorsal surface and plantar metatarsal head surfaces. He has thickened, hard, almost leathery skin on the midfoot and heel bilaterally. No obvious open wounds. Integumentary (Hair, Skin) Wound #1 status is Open. Original cause of wound was Gradually Appeared. The date acquired was: 02/20/2023. The wound is located on the Right Foot. The wound measures 1cm length x 7cm width x 0.1cm depth; 5.498cm^2 area and 0.55cm^3 volume. There is Fat Layer (Subcutaneous Tissue) exposed. There is no tunneling or undermining noted.  There is a medium amount of serosanguineous drainage noted. There is medium (34-66%) red granulation within the wound bed. There is a medium (34-66%) amount of necrotic tissue within the wound bed  including Eschar and Adherent Slough. The periwound skin appearance exhibited: Callus. Wound #2 status is Open. Original cause of wound was Gradually Appeared. The date acquired was: 02/20/2023. The wound is located on the Right,Proximal,Plantar Foot. The wound measures 0.3cm length x 7cm width x 0.1cm depth; 1.649cm^2 area and 0.165cm^3 volume. There is Fat Layer (Subcutaneous Tissue) exposed. There is no tunneling or undermining noted. There is a medium amount of serosanguineous drainage noted. There is small (1- 33%) pink granulation within the wound bed. There is a large (67-100%) amount of necrotic tissue within the wound bed including Eschar and Adherent Slough. Wound #3 status is Open. Original cause of wound was Gradually Appeared. The date acquired was: 02/20/2023. The wound is located on the Left,Plantar Foot. The wound measures 1cm length x 9cm width x 0.1cm depth; 7.069cm^2 area and 0.707cm^3 volume. There is Fat Layer (Subcutaneous Tissue) exposed. There is no tunneling or undermining noted. There is a medium amount of serosanguineous drainage noted. There is large (67-100%) red granulation within the wound bed. There is a small (1-33%) amount of necrotic tissue within the wound bed including Eschar and Adherent Slough. Wound #4 status is Open. Original cause of wound was Gradually Appeared. The date acquired was: 02/20/2023. The wound is located on the Right,Dorsal Foot. The wound measures 1cm length x 2.5cm width x 0.1cm depth; 1.963cm^2 area and 0.196cm^3 volume. There is Fat Layer (Subcutaneous Tissue) exposed. There is no tunneling or undermining noted. There is a medium amount of serosanguineous drainage noted. There is large (67-100%) red granulation within the wound bed. There is a small (1-33%) amount of necrotic tissue within the wound bed including Eschar and Adherent Slough. Wound #5 status is Open. Original cause of wound was Gradually Appeared. The date acquired was: 02/20/2023.  The wound is located on the Left,Dorsal Foot. The wound measures 0.3cm length x 0.3cm width x 0.1cm depth; 0.071cm^2 area and 0.007cm^3 volume. There is Fat Layer (Subcutaneous Tissue) exposed. There is no tunneling or undermining noted. There is a medium amount of serosanguineous drainage noted. There is large (67-100%) red granulation within the wound bed. There is a small (1-33%) amount of necrotic tissue within the wound bed including Adherent Slough. Wound #6 status is Open. Original cause of wound was Gradually Appeared. The date acquired was: 02/20/2023. The wound is located on the Right Hand - 2nd Digit. The wound measures 0.3cm length x 1cm width x 0.1cm depth; 0.236cm^2 area and 0.024cm^3 volume. There is Fat Layer (Subcutaneous Tissue) exposed. There is no tunneling or undermining noted. There is a medium amount of serosanguineous drainage noted. There is medium (34-66%) red granulation within the wound bed. There is a medium (34-66%) amount of necrotic tissue within the wound bed including Eschar and Adherent Slough. Assessment Active Problems ICD-10 Changes in skin texture Other specified disorders of the skin and subcutaneous tissue Plan Follow-up Appointments: Return Appointment in 2 weeks. - Dr Lady Gary Wednesday 04/05/23 @ 7:45am Anesthetic: Wound #1 Right Foot: (In clinic) Topical Lidocaine 5% applied to wound bed Wound #2 Right,Proximal,Plantar Foot: (In clinic) Topical Lidocaine 5% applied to wound bed Wound #3 Left,Plantar Foot: (In clinic) Topical Lidocaine 5% applied to wound bed Wound #4 Right,Dorsal Foot: (In clinic) Topical Lidocaine 5% applied to wound bed Wound #5 Left,Dorsal Foot: (In clinic) Topical Lidocaine  5% applied to wound bed Wound #6 Right Hand - 2nd Digit: (In clinic) Topical Lidocaine 5% applied to wound bed Bathing/ Shower/ Hygiene: May shower and wash wound with soap and water. Consults ordered were: Dermatology - [facility] for  evaluation and treatment of desquamating rash of bilateral hands and feet The following medication(s) was prescribed: clobetasol topical 0.05 % cream Apply a thin layer to bilateral palms and soles daily starting 03/22/2023 WOUND #1: - Foot Wound Laterality: Right RENAUD, WALLACK ([id number]) [phone].pdf Page 8 of 11 Cleanser: Soap and Water 1 x Per Day/Other: Discharge Instructions: May shower and wash wound with dial antibacterial soap and water prior to dressing change. Peri-Wound Care: Triamcinolone 15 (g) 1 x Per Day/Other: Discharge Instructions: Use triamcinolone 15 (g) as directed Topical: Triamcinolone 1 x Per Day/Other: Discharge Instructions: Apply Triamcinolone as directed Prim Dressing: Maxorb Extra CMC/Alginate Dressing, 4x4 (in/in) 1 x Per Day/Other: ary Discharge Instructions: Apply to wound bed as instructed Secondary Dressing: Woven Gauze Sponge, Non-Sterile 4x4 in 1 x Per Day/Other: Discharge Instructions: Apply over primary dressing as directed. Secured With: [employer], 4.5x3.1 (in/yd) 1 x Per Day/Other: Discharge Instructions: Secure with Kerlix as directed. Secured With: Transpore Surgical T ape, 2x10 (in/yd) 1 x Per Day/Other: Discharge Instructions: Secure dressing with tape as directed. WOUND #2: - Foot Wound Laterality: Plantar, Right, Proximal Cleanser: Soap and Water Discharge Instructions: May shower and wash wound with dial antibacterial soap and water prior to dressing change. Peri-Wound Care: Triamcinolone 15 (g) Discharge Instructions: Use triamcinolone 15 (g) as directed Topical: Triamcinolone Discharge Instructions: Apply Triamcinolone as directed Prim Dressing: Maxorb Extra CMC/Alginate Dressing, 4x4 (in/in) ary Discharge Instructions: Apply to wound bed as instructed Secondary Dressing: Woven Gauze Sponge, Non-Sterile 4x4 in Discharge Instructions: Apply over primary dressing as directed. Secured With: [employer], 4.5x3.1 (in/yd) Discharge Instructions: Secure with Kerlix as directed. Secured With: Transpore Surgical T ape, 2x10 (in/yd) Discharge Instructions: Secure dressing with tape as directed. WOUND #3: - Foot Wound Laterality: Plantar, Left Cleanser: Soap and Water Discharge Instructions: May shower and wash wound with dial antibacterial soap and water prior to dressing change. Peri-Wound Care: Triamcinolone 15 (g) Discharge Instructions: Use triamcinolone 15 (g) as directed Topical: Triamcinolone Discharge Instructions: Apply Triamcinolone as directed Prim Dressing: Maxorb Extra CMC/Alginate Dressing, 4x4 (in/in) ary Discharge Instructions: Apply to wound bed as instructed Secondary Dressing: Woven Gauze Sponge, Non-Sterile 4x4 in Discharge Instructions: Apply over primary dressing as directed. Secured With: [employer], 4.5x3.1 (in/yd) Discharge Instructions: Secure with Kerlix as directed. Secured With: Transpore Surgical T ape, 2x10 (in/yd) Discharge Instructions: Secure dressing with tape as directed. WOUND #4: - Foot Wound Laterality: Dorsal, Right Cleanser: Soap and Water Discharge Instructions: May shower and wash wound with dial antibacterial soap and water prior to dressing change. Peri-Wound Care: Triamcinolone 15 (g) Discharge Instructions: Use triamcinolone 15 (g) as directed Topical: Triamcinolone Discharge Instructions: Apply Triamcinolone as directed Prim Dressing: Maxorb Extra CMC/Alginate Dressing, 4x4 (in/in) ary Discharge Instructions: Apply to wound bed as instructed Secondary Dressing: Woven Gauze Sponge, Non-Sterile 4x4 in Discharge Instructions: Apply over primary dressing as directed. Secured With: [employer], 4.5x3.1 (in/yd) Discharge Instructions: Secure with Kerlix as directed. Secured With: Trans[employer]ore Surgical T ape, 2x10 (in/yd) Discharge Instructions: Secure dressing with tape as directed. WOUND #5: - Foot Wound Laterality:  Dorsal, Left Cleanser: Soap and Water Discharge Instructions: May shower and wash wound with dial antibacterial soap and water prior to dressing change. Peri-Wound Care: Triamcinolone 15 (g)  Discharge Instructions: Use triamcinolone 15 (g) as directed Topical: Triamcinolone Discharge Instructions: Apply Triamcinolone as directed Prim Dressing: Maxorb Extra CMC/Alginate Dressing, 4x4 (in/in) ary Discharge Instructions: Apply to wound bed as instructed Secondary Dressing: Woven Gauze Sponge, Non-Sterile 4x4 in Discharge Instructions: Apply over primary dressing as directed. Secured With: [employer], 4.5x3.1 (in/yd) Discharge Instructions: Secure with Kerlix as directed. Secured With: Transpore Surgical T ape, 2x10 (in/yd) Discharge Instructions: Secure dressing with tape as directed. WOUND #6: - Hand - 2nd Digit Wound Laterality: Right Cleanser: Soap and Water 1 x Per Day/30 Days Discharge Instructions: May shower and wash wound with dial antibacterial soap and water prior to dressing change. Peri-Wound Care: Triamcinolone 15 (g) 1 x Per Day/30 Days Discharge Instructions: Use triamcinolone 15 (g) as directed Prim Dressing: Maxorb Extra Calcium Alginate, 2x2 (in/in) 1 x Per Day/30 Days ary Discharge Instructions: Apply to wound bed as instructed Secondary Dressing: Woven Gauze Sponges 2x2 in 1 x Per Day/30 Days Discharge Instructions: Apply over primary dressing as directed. Secured With: [employer] Surgical T ape, 2x10 (in/yd) 1 x Per Day/30 Days Discharge Instructions: Secure dressing with tape as directed. 03/22/2023: This is a 52 year old man presenting with a desquamating skin rash of the bilateral palms and soles that started near the end of September. The skin on the palms of his hands is cracked and shiny in appearance. The only open area is on his right index finger with a superficial fissure in the skin. His feet appear to have tiny fissures between the toes with areas  of recently peeled skin on the dorsal surface and plantar metatarsal head surfaces. He has thickened, hard, almost leathery skin on the midfoot and heel bilaterally. No obvious open wounds. SHAMON, OGAN R ([id number]) [id number] Page 9 of 11 His PCP was of the impression that that the rash was secondary to onychomycosis and prescribed terbinafine. There appeared to also be secondary bacterial infection, for which he is now taking ciprofloxacin. He actually does not have any obvious open wounds other than a small fissure on his right index finger. He does seem to be draining from the cracks between his toes, however. I think this patient requires evaluation by dermatology, but given that it may be sometime before he is able to get in for an appointment, he should continue the terbinafine and complete his course of ciprofloxacin. I have prescribed clobetasol for him to apply to his palms and soles. Will have him lightly wrapped his feet with Kerlix and advised him to wear gloves, such as likely cotton gloves on his hands while he is at work. We will put silver alginate between his toes to try and help absorb the moisture and also apply it to the wound on his finger. He will follow-up with me in 2 weeks, at which time we will assess whether or not our interventions are having any positive effect; if not, we will look at other options. Electronic Signature(s) Signed: 03/22/2023 5:47:55 PM By: Duanne Guess MD FACS Entered By: Duanne Guess on 03/22/2023 17:47:55 -------------------------------------------------------------------------------- HxROS Details Patient Name: Date of Service: Meryle Ready R. 03/22/2023 12:45 PM Medical Record Number: [id number] Patient Account Number: [id number] Date of Birth/Sex: Treating RN: December 11, 1970 (52 y.o. [name] Primary Care Provider: Jiles Prows Other Clinician: Referring Provider: Treating Provider/Extender:  Darci Current in Treatment: 0 Information Obtained From Patient Constitutional Symptoms (General Health) Complaints and Symptoms: Negative for: Fatigue; Fever; Chills; Marked Weight Change Eyes Complaints and Symptoms: Negative for:  Dry Eyes; Vision Changes; Glasses / Contacts Ear/Nose/Mouth/Throat Complaints and Symptoms: Negative for: Chronic sinus problems or rhinitis Respiratory Complaints and Symptoms: Negative for: Chronic or frequent coughs; Shortness of Breath Cardiovascular Complaints and Symptoms: Negative for: Chest pain Gastrointestinal Complaints and Symptoms: Negative for: Frequent diarrhea; Nausea; Vomiting Endocrine Complaints and Symptoms: Negative for: Heat/cold intolerance Medical History: Past Medical History NotesMarland Kitchen SALIOU, MCILVAIN ([id number]) [phone].pdf Page 10 of 11 thyroid nodule Genitourinary Complaints and Symptoms: Negative for: Frequent urination Integumentary (Skin) Complaints and Symptoms: Positive for: Wounds - bilateral feet Musculoskeletal Complaints and Symptoms: Negative for: Muscle Pain; Muscle Weakness Neurologic Complaints and Symptoms: Negative for: Numbness/parasthesias Psychiatric Complaints and Symptoms: Negative for: Claustrophobia Hematologic/Lymphatic Immunological Oncologic Immunizations Pneumococcal Vaccine: Received Pneumococcal Vaccination: No Implantable Devices None Family and Social History Cancer: Yes - Mother,Siblings; Diabetes: Yes - Father,Mother; Heart Disease: Yes - Father; Hypertension: Yes - Mother,Father; Kidney Disease: Yes - Mother; Lung Disease: No; Seizures: Yes - Siblings; Stroke: Yes - Siblings; Former smoker; Marital Status - Married; Alcohol Use: Daily; Drug Use: Current History - marijuana; Caffeine Use: Daily; Financial Concerns: No; Food, Clothing or Shelter Needs: No; Support System Lacking: No; 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 03/22/2023 Medical Record Number: [id number] Patient Account Number: [id number] Date of Birth/Sex: Treating RN: 29-May-1970 (52 y.o. [name] Primary Care Provider: Jiles Prows Other Clinician: Referring Provider: Treating Provider/Extender: Darci Current in Treatment: 0 Diagnosis Coding ICD-10 Codes Code Description PALANI, KERWICK ([id number]) [id number] Page 11 of 11 R23.4 Changes in skin texture L98.8 Other specified disorders of the skin and subcutaneous tissue Facility Procedures : CPT4 Code: 63016010 Description: 93235 - WOUND CARE VISIT-LEV 5 EST PT Modifier: Quantity: 1 Physician Procedures : CPT4 Code Description Modifier 5732202 [id number] - WC PHYS LEVEL 4 - NEW PT ICD-10 Diagnosis Description R23.4 Changes in skin texture L98.8 Other specified disorders of the skin and subcutaneous tissue Quantity: 1 Electronic Signature(s) Signed: 03/22/2023 5:50:04 PM By: Duanne Guess MD FACS Previous Signature: 03/22/2023 5:03:07 PM Version By: Redmond Pulling RN, BSN Entered By: Duanne Guess on 03/22/2023 17:50:04

## 2023-03-24 NOTE — Telephone Encounter (Signed)
Call could not be completed at this time.  

## 2023-03-27 NOTE — Telephone Encounter (Signed)
Call could not be completed. 

## 2023-04-05 ENCOUNTER — Ambulatory Visit (HOSPITAL_BASED_OUTPATIENT_CLINIC_OR_DEPARTMENT_OTHER): Payer: Medicaid Other | Admitting: General Surgery

## 2023-04-28 ENCOUNTER — Ambulatory Visit: Payer: Self-pay | Admitting: Nurse Practitioner

## 2023-05-26 ENCOUNTER — Ambulatory Visit (INDEPENDENT_AMBULATORY_CARE_PROVIDER_SITE_OTHER): Payer: 59 | Admitting: Nurse Practitioner

## 2023-05-26 VITALS — BP 122/74 | HR 75 | Temp 98.2°F | Ht 69.0 in | Wt 189.2 lb

## 2023-05-26 DIAGNOSIS — E041 Nontoxic single thyroid nodule: Secondary | ICD-10-CM

## 2023-05-26 DIAGNOSIS — B351 Tinea unguium: Secondary | ICD-10-CM

## 2023-05-26 DIAGNOSIS — N529 Male erectile dysfunction, unspecified: Secondary | ICD-10-CM | POA: Diagnosis not present

## 2023-05-26 DIAGNOSIS — L97322 Non-pressure chronic ulcer of left ankle with fat layer exposed: Secondary | ICD-10-CM | POA: Diagnosis not present

## 2023-05-26 MED ORDER — SILDENAFIL CITRATE 100 MG PO TABS: 100.0000 mg | ORAL_TABLET | Freq: Every day | ORAL | 2 refills | Status: DC | PRN

## 2023-05-26 NOTE — Assessment & Plan Note (Signed)
 Resolved, no additional recommendation regarding treatment made today.

## 2023-05-26 NOTE — Assessment & Plan Note (Signed)
 Discussed the importance of completing follow-up ultrasound as previously recommended.  Patient reports understanding.  Ultrasound reordered today.

## 2023-05-26 NOTE — Assessment & Plan Note (Signed)
 This is much improved.  Still has evidence of fungal disease to his bilateral toenails.  Skin has healed has some hypopigmentation but no open wounds.  Pain has resolved.  We discussed an additional 4 to 6 weeks of terbinafine , but patient would prefer not to take any more of the medication.  I recommended checking liver function testing today even though he has been off the terbinafine  for a few weeks now, patient declined blood work today.  He denied any abdominal pain, nausea, vomiting, change in color of eyes or skin, no jaundice identified on exam today either.

## 2023-05-26 NOTE — Assessment & Plan Note (Signed)
 Chronic, intermittent Refill sildenafil 100 mg tablet to be taken once per day as needed for erectile dysfunction.

## 2023-05-26 NOTE — Progress Notes (Signed)
 Established Patient Office Visit  Subjective   Patient ID: Barry Wood, male    DOB: Nov 18, 1970  Age: 53 y.o. MRN: [id number]  No chief complaint on file.  Thyroid  nodule: Incidental finding noted on CT in May 2023, recommend ultrasound follow-up.  A serial ultrasound was ordered but patient never did have this completed.  Not experiencing any symptoms currently.    Onychomycosis, wound: Treated with 4 weeks of terbinafine , did not follow-up to repeat liver function testing and get refill due to loss of medical insurance.  Now has medical insurance again so he is following up today.  Reports wound and fungal disease is much improved since completing 4 weeks of the terbinafine .  Reports having abstained from alcohol during terbinafine  course, but has restarted drinking intermittently.  Erectile dysfunction: Take sildenafil  as needed.  Requesting refill today.     Review of Systems  Respiratory:  Negative for shortness of breath.   Cardiovascular:  Negative for chest pain.  Skin:  Negative for rash.      Objective:     BP 122/74   Pulse 75   Temp 98.2 F (36.8 C) (Temporal)   Ht 5' 9 (1.753 m)   Wt 189 lb 4 oz (85.8 kg)   SpO2 96%   BMI 27.95 kg/m  BP Readings from Last 3 Encounters:  05/26/23 122/74  03/03/23 136/80  02/23/23 (!) 139/97   Wt Readings from Last 3 Encounters:  05/26/23 189 lb 4 oz (85.8 kg)  02/23/23 185 lb 3 oz (84 kg)  01/27/23 187 lb (84.8 kg)      Physical Exam Vitals reviewed.  Constitutional:      Appearance: Normal appearance.  HENT:     Head: Normocephalic and atraumatic.  Cardiovascular:     Rate and Rhythm: Normal rate and regular rhythm.  Pulmonary:     Effort: Pulmonary effort is normal.     Breath sounds: Normal breath sounds.  Musculoskeletal:     Cervical back: Neck supple.  Skin:    General: Skin is warm and dry.     Comments: Wound of lower extremity and toes has completely closed. He has some hypopigmentation of  toes. Fungal disease is still present in toenails.  Neurological:     Mental Status: He is alert and oriented to person, place, and time.  Psychiatric:        Mood and Affect: Mood normal.        Behavior: Behavior normal.        Thought Content: Thought content normal.        Judgment: Judgment normal.      No results found for any visits on 05/26/23.    The 10-year ASCVD risk score (Arnett DK, et al., 2019) is: 5.5%    Assessment & Plan:   Problem List Items Addressed This Visit       Endocrine   Thyroid  nodule   Discussed the importance of completing follow-up ultrasound as previously recommended.  Patient reports understanding.  Ultrasound reordered today.      Relevant Orders   US  THYROID      Musculoskeletal and Integument   Onychomycosis - Primary   This is much improved.  Still has evidence of fungal disease to his bilateral toenails.  Skin has healed has some hypopigmentation but no open wounds.  Pain has resolved.  We discussed an additional 4 to 6 weeks of terbinafine , but patient would prefer not to take any more of the medication.  I  recommended checking liver function testing today even though he has been off the terbinafine  for a few weeks now, patient declined blood work today.  He denied any abdominal pain, nausea, vomiting, change in color of eyes or skin, no jaundice identified on exam today either.      Relevant Orders   Comprehensive metabolic panel   RESOLVED: Lower limb ulcer, ankle, left, with fat layer exposed (HCC)   Resolved, no additional recommendation regarding treatment made today.      Relevant Orders   Comprehensive metabolic panel     Other   Erectile dysfunction   Chronic, intermittent Refill sildenafil  100 mg tablet to be taken once per day as needed for erectile dysfunction.      Relevant Medications   sildenafil  (VIAGRA ) 100 MG tablet    Return in about 9 months (around 02/23/2024) for CPE with Lauraine.    Lauraine FORBES Pereyra,  NP

## 2023-06-15 ENCOUNTER — Encounter: Payer: Self-pay | Admitting: Internal Medicine

## 2023-06-15 ENCOUNTER — Ambulatory Visit (INDEPENDENT_AMBULATORY_CARE_PROVIDER_SITE_OTHER): Payer: 59 | Admitting: Internal Medicine

## 2023-06-15 ENCOUNTER — Ambulatory Visit: Payer: 59

## 2023-06-15 VITALS — BP 120/80 | HR 67 | Temp 97.9°F | Ht 69.0 in | Wt 196.0 lb

## 2023-06-15 DIAGNOSIS — M549 Dorsalgia, unspecified: Secondary | ICD-10-CM

## 2023-06-15 DIAGNOSIS — M542 Cervicalgia: Secondary | ICD-10-CM | POA: Diagnosis not present

## 2023-06-15 NOTE — Patient Instructions (Signed)
We will check the x-ray of the back

## 2023-06-15 NOTE — Progress Notes (Signed)
   Subjective:   Patient ID: Barry Wood, male    DOB: 1970/11/15, 53 y.o.   MRN: [id number]  HPI The patient is a 53 YO man coming in for work injury on 06/05/23 and sought care for this 06/05/23 [place] [facility] given a shot and took muscle relaxers did not help a lot. He then went back about a week later and they did not do anything. He is being asked to return to work soon and is still having significant pain. Feels he needs x-ray but the place he was at did not have this capability. Denies numbness or weakness in arms or legs. Pain in neck to low spine and does radiate at times into upper legs or shoulder region.   Review of Systems  Constitutional:  Positive for activity change. Negative for appetite change, fatigue, fever and unexpected weight change.  Respiratory: Negative.    Cardiovascular: Negative.   Musculoskeletal:  Positive for back pain, myalgias, neck pain and neck stiffness. Negative for arthralgias.  Skin: Negative.   Neurological:  Negative for syncope, weakness and numbness.    Objective:  Physical Exam Constitutional:      Appearance: He is well-developed.  HENT:     Head: Normocephalic and atraumatic.  Cardiovascular:     Rate and Rhythm: Normal rate and regular rhythm.  Pulmonary:     Effort: Pulmonary effort is normal. No respiratory distress.     Breath sounds: Normal breath sounds. No wheezing or rales.  Abdominal:     General: Bowel sounds are normal. There is no distension.     Palpations: Abdomen is soft.     Tenderness: There is no abdominal tenderness. There is no rebound.  Musculoskeletal:        General: Tenderness present.     Cervical back: Normal range of motion.     Comments: Tenderness cervical spine to lumbar spine both spinally and paraspinally.   Skin:    General: Skin is warm and dry.  Neurological:     Mental Status: He is alert and oriented to person, place, and time.     Coordination: Coordination normal.     Vitals:    06/15/23 0953  BP: 120/80  Pulse: 67  Temp: 97.9 F (36.6 C)  TempSrc: Oral  SpO2: 99%  Weight: 196 lb (88.9 kg)  Height: 5\' 9"  (1.753 m)    Assessment & Plan:

## 2023-06-16 DIAGNOSIS — M549 Dorsalgia, unspecified: Secondary | ICD-10-CM | POA: Insufficient documentation

## 2023-06-16 NOTE — Assessment & Plan Note (Signed)
Checking x-ray cervical, thoracic and lumbar.

## 2023-06-19 ENCOUNTER — Encounter: Payer: Self-pay | Admitting: Internal Medicine

## 2023-06-28 ENCOUNTER — Other Ambulatory Visit: Payer: 59

## 2023-06-29 ENCOUNTER — Ambulatory Visit
Admission: RE | Admit: 2023-06-29 | Discharge: 2023-06-29 | Disposition: A | Discharge status: Home / Self Care | Payer: 59 | Source: Ambulatory Visit | Attending: Nurse Practitioner | Admitting: Nurse Practitioner

## 2023-06-29 DIAGNOSIS — E041 Nontoxic single thyroid nodule: Secondary | ICD-10-CM

## 2023-07-03 ENCOUNTER — Other Ambulatory Visit: Payer: Self-pay | Admitting: Nurse Practitioner

## 2023-07-03 DIAGNOSIS — E041 Nontoxic single thyroid nodule: Secondary | ICD-10-CM

## 2023-07-06 ENCOUNTER — Telehealth: Payer: Self-pay | Admitting: Nurse Practitioner

## 2023-07-06 NOTE — Telephone Encounter (Signed)
Copied from CRM [id number]. Topic: Clinical - Prescription Issue >> Jul 06, 2023 10:39 AM Pascal Lux wrote: Reason for CRM: Patient called stated he went to go pick up his medication sildenafil (VIAGRA) 100 MG tablet [[id number]] and insurance is saying it won't be available until February 18th because it's not a daily medication.

## 2023-07-10 NOTE — Telephone Encounter (Signed)
 Called pt and made him aware of this issue. Pt will be calling his insurance in regards to this

## 2023-07-24 ENCOUNTER — Other Ambulatory Visit: Payer: Self-pay | Admitting: General Surgery

## 2023-07-24 DIAGNOSIS — E041 Nontoxic single thyroid nodule: Secondary | ICD-10-CM

## 2023-07-29 ENCOUNTER — Other Ambulatory Visit: Payer: Self-pay | Admitting: Nurse Practitioner

## 2023-07-29 DIAGNOSIS — N529 Male erectile dysfunction, unspecified: Secondary | ICD-10-CM

## 2023-08-02 ENCOUNTER — Encounter: Payer: Self-pay | Admitting: Orthopaedic Surgery

## 2023-08-02 ENCOUNTER — Ambulatory Visit (INDEPENDENT_AMBULATORY_CARE_PROVIDER_SITE_OTHER): Payer: Worker's Compensation | Admitting: Orthopaedic Surgery

## 2023-08-02 VITALS — Ht 69.0 in | Wt 187.8 lb

## 2023-08-02 DIAGNOSIS — M545 Low back pain, unspecified: Secondary | ICD-10-CM

## 2023-08-02 NOTE — Progress Notes (Signed)
 Office Visit Note   Patient: Barry Wood           Date of Birth: 1970-09-08           MRN: [id number] Visit Date: 08/02/2023              Requested by: Elenore Paddy, NP [address] [place],  [place] [address] PCP: Elenore Paddy, NP   Assessment & Plan: Visit Diagnoses:  1. Acute bilateral low back pain without sciatica     Plan: X-rays were reviewed with the patient exam was reviewed.  No evidence of radiculopathy.  He is moving well no nerve root tension signs.  Work slip given for continued restriction for lifting more than 20 pounds x 4 weeks.  He can follow-up with one of my partners in 4 weeks and at that point should be able to have his lifting weight restriction either increased or discontinued.  If he develops radicular symptoms and lumbar MRI can be considered.  Follow-Up Instructions: No follow-ups on file.   Orders:  No orders of the defined types were placed in this encounter.  No orders of the defined types were placed in this encounter.     Procedures: No procedures performed   Clinical Data: No additional findings.   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  He has coworker who could not lift the opposite and he switched with him and eventually they just put 1 in with the 2 of them up on the tailgate and when trying to lift the opposite and he felt a sharp pop in his back which was painful.  He was seen at a urgent care had urine test placed on anti-inflammatories eventually soft PCP who did x-ray since they were not available at the urgent care.  X-rays are available on PACS for review and it did show some slight narrowing chronic endplate irregularity at C5-6.  Slight scoliosis noted  which was present on old chest x-ray exam thoracic x-rays more than 5 years ago reviewed on PACS with patient.  He is placed initially on lifting restrictions less than 10 pounds.  This was later increased to 20 pounds.  He is continuing to work daily.  He just not doing any lifting more than 20 pounds.  No bowel bladder associated symptoms.  He still has some pain in his lower back.  He had been prescribed Mobic, Flexeril, Naprosyn and also some ibuprofen and currently is not on any of them.  Review of Systems all other systems noncontributory to HPI.   Objective: Vital Signs: Ht 5\' 9"  (1.753 m)   Wt 187 lb 12.8 oz (85.2 kg)   BMI 27.73 kg/m   Physical Exam Constitutional:      Appearance: He is well-developed.  HENT:     Head: Normocephalic and atraumatic.     Right Ear: External ear normal.     Left Ear: External ear normal.  Eyes:     Pupils: Pupils are equal, round, and reactive to light.  Neck:     Thyroid: No thyromegaly.     Trachea: No tracheal deviation.  Cardiovascular:     Rate and Rhythm: Normal rate.  Pulmonary:     Effort: Pulmonary effort is normal.     Breath sounds: No wheezing.  Abdominal:     General: Bowel sounds are normal.     Palpations: Abdomen is soft.  Musculoskeletal:     Cervical back: Neck supple.  Skin:    General: Skin is warm and dry.     Capillary Refill: Capillary refill takes less than 2 seconds.  Neurological:     Mental Status: He is alert and oriented to person, place, and time.  Psychiatric:        Behavior: Behavior normal.        Thought Content: Thought content normal.        Judgment: Judgment normal.     Ortho Exam patient gets up quickly from sitting to standing and ambulate on his heels and toes negative straight leg raising 90 degrees reflexes are symmetrical anterior tib gastrocsoleus is strong no lower extremity atrophy.  Mild tenderness to lumbar spine.  Good cervical range of motion.  Normal reflexes upper  extremities.  Specialty Comments:  No specialty comments available.  Imaging:  DG Lumbar Spine Complete Final result 06/15/2023 10:24 AM    Narrative  CLINICAL DATA:  Back pain after lifting a heavy object at work on 06/05/2023.  EXAM: LUMBAR SPINE - COMPLETE 4+ VIEW  COMPARISON:  None Available.  FINDINGS: No fracture, bone lesion or spondylolisthesis. Slight curvature, ...       DG Thoracic Spine W/Swimmers Final result 06/15/2023 10:24 AM    Narrative  CLINICAL DATA:  Back pain after lifting a heavy object on 06/05/2023.  EXAM: THORACIC SPINE - 3 VIEWS  COMPARISON:  None Available.  FINDINGS: No fracture or bone lesion.  No spondylolisthesis. ...       Study Result  Narrative & Impression  CLINICAL DATA:  Neck after lifting a heavy object on 06/05/2023   EXAM: CERVICAL SPINE - COMPLETE 4+ VIEW   COMPARISON:  06/24/2021.   FINDINGS: Normal vertebral body stature alignment.  No bone lesion.   Mild loss of disc height at C5-C6. Remaining disc spaces are well preserved.   Soft tissues are unremarkable.   IMPRESSION: 1. No fracture or acute finding. 2. Mild disc degenerative changes at C5-C6. No change from the prior radiographs.     Electronically Signed   By: Amie Portland M.D.   On: 06/19/2023 13:16     PMFS History: Patient Active Problem List   Diagnosis Date Noted   Acute bilateral back pain 06/16/2023   Onychomycosis 03/03/2023   Thiamine deficiency 01/27/2023   Vitamin D deficiency 01/27/2023   Encounter for lipid screening for cardiovascular disease 01/27/2023   Chest wall mass 07/15/2022   Thyroid nodule 12/24/2021   Encounter for general adult medical examination with abnormal findings 04/30/2019   Erectile dysfunction 04/30/2019   Past Medical History:  Diagnosis Date   Erectile dysfunction    Gonorrhea    Prediabetes    Vitamin D deficiency     Family History  Problem Relation Age of Onset   Heart disease Mother     Cancer Mother        Kidney   Heart disease Father     Past Surgical History:  Procedure Laterality Date   CYST EXCISION     Social History   Occupational History   Not on file  Tobacco Use   Smoking status: Never   Smokeless tobacco: Never  Vaping Use   Vaping status: Never Used  Substance  and Sexual Activity   Alcohol use: Yes    Alcohol/week: 4.0 standard drinks of alcohol    Types: 4 Shots of liquor per week    Comment: Occasional   Drug use: Yes    Types: Marijuana   Sexual activity: Yes    Birth control/protection: None

## 2023-08-14 ENCOUNTER — Telehealth: Payer: Self-pay | Admitting: Nurse Practitioner

## 2023-08-14 NOTE — Telephone Encounter (Signed)
 Copied from CRM [id number]. Topic: Clinical - Prescription Issue >> Aug 14, 2023  1:20 PM Makayla J wrote: Reason for CRM: Pt was told by his insurance provider that in order for him to receive a full 30-day supply instead of a 10 day supply of sildenafil (VIAGRA) 100 MG tablet they are requesting a pre authorization. The pt left a number to his insurance company [phone] [employer]

## 2023-08-17 ENCOUNTER — Other Ambulatory Visit (HOSPITAL_COMMUNITY): Payer: Self-pay

## 2023-08-17 ENCOUNTER — Telehealth: Payer: Self-pay

## 2023-08-17 NOTE — Telephone Encounter (Signed)
 Pharmacy Patient Advocate Encounter   Received notification from Pt Calls Messages that prior authorization for Sildenafil 100mg   is required/requested.   Insurance verification completed.   The patient is insured through [facility] .   Per test claim: PA required; PA submitted to above mentioned insurance via CoverMyMeds Key/confirmation #/EOC [id number] Status is pending

## 2023-08-25 ENCOUNTER — Other Ambulatory Visit (HOSPITAL_COMMUNITY): Payer: Self-pay

## 2023-08-25 ENCOUNTER — Inpatient Hospital Stay: Admission: RE | Admit: 2023-08-25 | Source: Ambulatory Visit

## 2023-08-25 NOTE — Telephone Encounter (Signed)
 Pharmacy Patient Advocate Encounter  Received notification from [facility] that Prior Authorization for Sildenafil has been [employer] cannot perform this prior authorization request because prior authorization is not required for the requested drug and medication quantities above the benefit limit are excluded under the plan.   PA #/Case ID/Reference #: [id number]

## 2023-08-28 ENCOUNTER — Other Ambulatory Visit (HOSPITAL_COMMUNITY): Payer: Self-pay

## 2023-08-31 ENCOUNTER — Encounter: Payer: Self-pay | Admitting: Orthopedic Surgery

## 2023-08-31 ENCOUNTER — Other Ambulatory Visit: Payer: Self-pay | Admitting: Nurse Practitioner

## 2023-08-31 ENCOUNTER — Ambulatory Visit: Admitting: Orthopedic Surgery

## 2023-08-31 VITALS — BP 189/125 | HR 80 | Ht 69.0 in | Wt 185.0 lb

## 2023-08-31 DIAGNOSIS — M5126 Other intervertebral disc displacement, lumbar region: Secondary | ICD-10-CM | POA: Diagnosis not present

## 2023-08-31 DIAGNOSIS — M545 Low back pain, unspecified: Secondary | ICD-10-CM

## 2023-08-31 DIAGNOSIS — N529 Male erectile dysfunction, unspecified: Secondary | ICD-10-CM

## 2023-08-31 MED ORDER — SILDENAFIL CITRATE 100 MG PO TABS: 100.0000 mg | ORAL_TABLET | Freq: Every day | ORAL | 2 refills | Status: DC | PRN

## 2023-08-31 MED ORDER — GABAPENTIN 300 MG PO CAPS
300.0000 mg | ORAL_CAPSULE | Freq: Three times a day (TID) | ORAL | 5 refills | Status: AC
Start: 2023-08-31 — End: ?

## 2023-08-31 MED ORDER — PREDNISONE 10 MG (48) PO TBPK
ORAL_TABLET | Freq: Every day | ORAL | 0 refills | Status: AC
Start: 2023-08-31 — End: ?

## 2023-08-31 MED ORDER — TIZANIDINE HCL 4 MG PO TABS
4.0000 mg | ORAL_TABLET | Freq: Four times a day (QID) | ORAL | 0 refills | Status: DC | PRN
Start: 2023-08-31 — End: 2023-10-18

## 2023-08-31 NOTE — Progress Notes (Signed)
  Intake history:  BP (!) 189/125   Pulse 80   Ht 5\' 9"  (1.753 m)   Wt 185 lb (83.9 kg)   BMI 27.32 kg/m  Body mass index is 27.32 kg/m.    WHAT ARE WE SEEING YOU FOR TODAY?   back - lumbar/sacral  Radiation?: no.   Loss of bowel/urine control?  no  How long has this bothered you? (DOI?DOS?WS?)  approximately 3 month(s) ago  Anticoag.  No  Diabetes No  Heart disease No  Hypertension No  SMOKING HX No  Kidney disease No  Any ALLERGIES ____________ Allergies  Allergen Reactions   Diazepam Other (See Comments)    Hypes patient up   __________________________________   Treatment:  Have you taken:  Tylenol Yes  Advil Yes  Had PT No  Had injection No  Other  _________________________

## 2023-08-31 NOTE — Patient Instructions (Signed)
 We will get [facility] approval for the MRI scan And get [facility] approval for the visit with the spine specialist in [facility] will get a call from [facility] or from Doctor Phillips at our [place] office regarding both of these  If you do not hear anything within one week call your [facility] contact person

## 2023-08-31 NOTE — Progress Notes (Signed)
 Patient ID: Barry Wood, male   DOB: [birth year], 53 y.o.   MRN: [id number]  Encounter Diagnoses  Name Primary?   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 Ophelia Charter  Basically this is a 53 year old male who works as the Education administrator for local [employer]  He was injured at work he was given a lifting restriction he was started on some anti-inflammatories and muscle relaxers as well as Tylenol and he presents back for reevaluation in Dr. Marlene Bast retirement  He complains of worsening left leg pain lower back pain and that his employer is not upholding the work restriction  His exam is notable for decreased sensation in the L4 distribution lower back pain  Absent reflexes but this is bilateral in the knee and ankle  Imaging studies as noted in Dr. Marlene Bast prior notes  I recommend that he have a 10 pound lifting restriction for 6 weeks Patient had an MRI of his spine He should see Dr. Christell Constant for evaluation and management  Meds ordered this encounter  Medications   predniSONE (STERAPRED UNI-PAK 48 TAB) 10 MG (48) TBPK tablet    Sig: Take by mouth daily. 10 MG 12 days as directed    Dispense:  48 tablet    Refill:  0   tiZANidine (ZANAFLEX) 4 MG tablet    Sig: Take 1 tablet (4 mg total) by mouth every 6 (six) hours as needed for muscle spasms.    Dispense:  30 tablet    Refill:  0   gabapentin (NEURONTIN) 300 MG capsule    Sig: Take 1 capsule (300 mg total) by mouth 3 (three) times daily.    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

## 2023-09-04 ENCOUNTER — Telehealth: Payer: Self-pay | Admitting: Orthopedic Surgery

## 2023-09-04 ENCOUNTER — Encounter: Payer: Self-pay | Admitting: Orthopedic Surgery

## 2023-09-04 NOTE — Telephone Encounter (Signed)
 New note typed up and pt advised.

## 2023-09-04 NOTE — Telephone Encounter (Signed)
 Dr. Delfino Fellers pt - spoke w/the pt, he stated his work note needs to include no bending, no twisting and no long periods of standing in addition to the no lifting greater than 10lbs.  Please advise. [phone]

## 2023-09-07 NOTE — Telephone Encounter (Signed)
 Issue taken care of in different encounter

## 2023-09-11 ENCOUNTER — Inpatient Hospital Stay: Admission: RE | Admit: 2023-09-11 | Source: Ambulatory Visit

## 2023-09-16 ENCOUNTER — Other Ambulatory Visit

## 2023-09-18 ENCOUNTER — Ambulatory Visit
Admission: RE | Admit: 2023-09-18 | Discharge: 2023-09-18 | Disposition: A | Discharge status: Home / Self Care | Payer: Worker's Compensation | Source: Ambulatory Visit | Attending: Orthopedic Surgery | Admitting: Orthopedic Surgery

## 2023-09-18 DIAGNOSIS — M545 Low back pain, unspecified: Secondary | ICD-10-CM

## 2023-09-22 ENCOUNTER — Other Ambulatory Visit

## 2023-09-28 ENCOUNTER — Ambulatory Visit: Admitting: Orthopedic Surgery

## 2023-09-28 ENCOUNTER — Other Ambulatory Visit (INDEPENDENT_AMBULATORY_CARE_PROVIDER_SITE_OTHER): Payer: Worker's Compensation

## 2023-09-28 VITALS — BP 121/82 | HR 60 | Ht 69.0 in | Wt 189.0 lb

## 2023-09-28 DIAGNOSIS — M545 Low back pain, unspecified: Secondary | ICD-10-CM

## 2023-09-28 NOTE — Progress Notes (Signed)
 Orthopedic Spine Surgery Office Note  Assessment: Patient is a 53 y.o. 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   Plan: -Explained that initially conservative treatment is tried as a significant number of patients may experience relief with these treatment modalities. Discussed that the conservative treatments include:  -activity modification  -physical therapy  -over the counter pain medications  -medrol dosepak  -lumbar steroid injections -Patient has tried prednisone , gabapentin , tizanidine   -Recommended continuing the prednisone . He should discontinue the gabapentin  since that is unlikely to help. He can continue the tizanidine . He can use tylenol  up to 1000mg  three times per day -I also recommended he try PT. Referral provided to him today -If he is not better at our next visit, would try an ESI next -Patient should return to office in 5-6 weeks, x-rays at next visit: none   Patient expressed understanding of the plan and all questions were answered to the patient's satisfaction.   ___________________________________________________________________________   History:  Patient is a 53 y.o. male who presents today for lumbar spine.  Patient states that he has had back pain for the last couple of months.  He said it started in January 2025.  He says it started at work after he was lifting a transmission.  He feels the pain in the lower lumbar region.  It will radiate up into the mid lumbar region.  However, the majority of his pain is in the lower lumbar region.  He says that it is worse with any bending or flexing activity of the lumbar spine.  Gets better if he lays down and rests but does not completely go away.  He feels the pain on a daily basis.  He said has not gotten much better since onset.   Weakness: Denies Symptoms of imbalance: Denies Paresthesias and numbness: Denies Bowel or bladder incontinence: Denies Saddle anesthesia:  Denies  Treatments tried: prednisone , gabapentin , tizanidine   Review of systems: Denies fevers and chills, night sweats, unexplained weight loss, history of cancer. Has had pain that wakes him at night  Past medical history: None  Allergies: diazepam  Past surgical history:  Cyst excision  Social history: Denies use of nicotine product (smoking, vaping, patches, smokeless) Alcohol use: Yes, approximately 5 drinks per week Denies recreational drug use   Physical Exam:  BMI of 27.9  General: no acute distress, appears stated age Neurologic: alert, answering questions appropriately, following commands Respiratory: unlabored breathing on room air, symmetric chest rise Psychiatric: appropriate affect, normal cadence to speech   MSK (spine):  -Strength exam      Left  Right EHL    5/5  5/5 TA    5/5  5/5 GSC    5/5  5/5 Knee extension  5/5  5/5 Hip flexion   5/5  5/5  -Sensory exam    Sensation intact to light touch in L3-S1 nerve distributions of bilateral lower extremities  -Achilles DTR: 2/4 on the left, 2/4 on the right -Patellar tendon DTR: 2/4 on the left, 2/4 on the right  -Straight leg raise: Negative bilaterally -Femoral nerve stretch test: Negative bilaterally -Clonus: no beats bilaterally  -Left hip exam: No pain through range of motion, negative Stinchfield, negative FABER -Right hip exam: No pain through range of motion, negative Stinchfield, negative FABER  Imaging: XRs of the lumbar spine from 09/28/2023 were independently reviewed and interpreted, showing no significant degenerative changes.  No fracture or dislocation seen.  No evidence of instability on flexion/extension  views.  MRI of the lumbar spine from 09/18/2023 was independently reviewed and interpreted, showing an annular tear at L5/S1 with a central disc herniation.  No foraminal or lateral recess or central stenosis seen.   Patient name: Barry Wood Patient MRN: [id number] Date  of visit: 09/28/23

## 2023-10-04 ENCOUNTER — Encounter: Payer: Self-pay | Admitting: Radiology

## 2023-10-05 ENCOUNTER — Encounter (HOSPITAL_COMMUNITY): Payer: Self-pay

## 2023-10-05 ENCOUNTER — Other Ambulatory Visit: Payer: Self-pay

## 2023-10-05 ENCOUNTER — Ambulatory Visit (HOSPITAL_COMMUNITY): Payer: Worker's Compensation | Attending: Orthopedic Surgery

## 2023-10-05 DIAGNOSIS — M5459 Other low back pain: Secondary | ICD-10-CM | POA: Diagnosis not present

## 2023-10-05 DIAGNOSIS — Z7409 Other reduced mobility: Secondary | ICD-10-CM | POA: Diagnosis not present

## 2023-10-05 DIAGNOSIS — M545 Low back pain, unspecified: Secondary | ICD-10-CM | POA: Insufficient documentation

## 2023-10-05 NOTE — Therapy (Signed)
 OUTPATIENT PHYSICAL THERAPY THORACOLUMBAR EVALUATION   Patient Name: Barry Wood MRN: [id number] DOB:12/23/1970, 53 y.o., male Today's Date: 10/05/2023  END OF SESSION:  PT End of Session - 10/05/23 1720     Visit Number 1    Date for PT Re-Evaluation 11/02/23    Authorization Type [employer]    Authorization Time Period 30 visit limit    Authorization - Visit Number 1    Progress Note Due on Visit 10    PT Start Time 1515 (P)     PT Stop Time 1558 (P)     PT Time Calculation (min) 43 min (P)     Activity Tolerance Patient tolerated treatment well;Patient limited by pain (P)     Behavior During Therapy WFL for tasks assessed/performed (P)              Past Medical History:  Diagnosis Date   Erectile dysfunction    Gonorrhea    Prediabetes    Vitamin D  deficiency    Past Surgical History:  Procedure Laterality Date   CYST EXCISION     Patient Active Problem List   Diagnosis Date Noted   Acute bilateral back pain 06/16/2023   Onychomycosis 03/03/2023   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   Vitamin D  deficiency 01/27/2023   Encounter for lipid screening for cardiovascular disease 01/27/2023   Chest wall mass 07/15/2022   Thyroid  nodule 12/24/2021   Encounter for general adult medical examination with abnormal findings 04/30/2019   Erectile dysfunction 04/30/2019   Neck pain 03/03/2011   Leukopenia 02/01/2011   Allergic rhinitis 01/26/2011   Ear deformity, 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   Sebaceous cyst 10/29/2010    PCP: Zorita Hiss, NP   REFERRING PROVIDER: Diedra Fowler, MD  REFERRING DIAG: M54.50 (ICD-10-CM) - Lumbar pain  Rationale for Evaluation and Treatment: Rehabilitation  THERAPY DIAG:  Other low back pain  Impaired functional mobility and activity tolerance  ONSET DATE: January 2025  SUBJECTIVE:                                                                                                                                                                                            SUBJECTIVE STATEMENT: Pt reports going down to get a transmission, lifting it with a friend and feeling low back pop. Pt states he heard a pop, instant pain. Pt states he was working until the 15th of last month. Pt states back is a little better since he has been off but has noticed increased pain with functional transfers and twisting motions. Pt states if "tear" does not heal the doctor would perform a injection.  PERTINENT HISTORY:  None pertinent medical history, pt has good control of diet  PAIN:  Are you having pain? Yes: NPRS scale: 7/10 Pain location: mid low back, left groin and anterior thigh Pain description: shooting pains, transient in legs. Back is throbbing pain, constant Aggravating factors: twisting, bending forward Relieving factors: laying down, resting  PRECAUTIONS: None  RED FLAGS: None   WEIGHT BEARING RESTRICTIONS: No  FALLS:  Has patient fallen in last 6 months? 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  DIAGNOSTIC FINDINGS:  CLINICAL DATA:  Chronic low back pain.  Lifting injury in January.   EXAM: MRI LUMBAR SPINE WITHOUT CONTRAST   TECHNIQUE: Multiplanar, multisequence MR imaging of the lumbar spine was performed. No intravenous contrast was administered.   COMPARISON:  Lumbar spine radiographs 06/15/2023   FINDINGS: Segmentation:  Standard.   Alignment:  Trace retrolisthesis of L5 on S1.   Vertebrae: No fracture or suspicious marrow lesion. Mild Modic type 1 endplate changes anteriorly at T11-12 and L1-2. Moderate left and minimal  right facet edema at L4-5.   Conus medullaris and cauda equina: Conus extends to the L1-2 level. Conus and cauda equina appear normal.   Paraspinal and other soft tissues: Right renal sinus cysts with no routine follow-up imaging required.   Disc levels:   T12-L1: Negative.   L1-2: Mild facet hypertrophy without disc herniation or stenosis.   L2-3: Disc desiccation and mild disc space narrowing. Mild disc bulging eccentric to the left and mild facet hypertrophy without stenosis.   L3-4: Disc bulging and mild-to-moderate facet hypertrophy without stenosis.   L4-5: Disc bulging and mild-to-moderate right and severe left facet and ligamentum flavum hypertrophy result in mild right and mild-to-moderate left neural foraminal stenosis without spinal stenosis.   L5-S1: Disc desiccation and slight disc space narrowing. Small central disc protrusion with annular fissure, mild disc bulging, and mild facet and ligamentum flavum hypertrophy without significant stenosis.   IMPRESSION: 1. Multilevel lumbar disc and facet degeneration, most notable at L4-5 where there is mild-to-moderate left and mild right neural foraminal stenosis. 2. Moderate left facet edema at L4-5 which may be a source of back pain.  PATIENT SURVEYS:  Modified Oswestry 27/50   COGNITION: Overall cognitive status: Within functional limits for tasks assessed     SENSATION: WFL    POSTURE: decreased lumbar lordosis  PALPATION: Pt demonstrates tenderness to palpation and mobilization of T11-L5 spinal segments during CPA and UPAs on lefts side. Pt also demonstrates increased tone in erector spinae muscle group from thoracic segments to lumbar segments.  LUMBAR ROM:   AROM eval  Flexion 40 degrees with pain  Extension 2, hurts worse  Right lateral flexion 18, pain  Left lateral flexion 10, worse  Right rotation 75%, pain  Left rotation 75%, pain   (Blank rows = not tested)  LOWER EXTREMITY ROM:      Active  Right eval Left eval  Hip flexion    Hip extension    Hip abduction    Hip adduction    Hip internal rotation    Hip external rotation    Knee flexion  Knee extension    Ankle dorsiflexion    Ankle plantarflexion    Ankle inversion    Ankle eversion     (Blank rows = not tested)  LOWER EXTREMITY MMT:    MMT Right eval Left eval  Hip flexion 4 4-  Hip extension 4 4-  Hip abduction 4 4-  Hip adduction 4 4-  Hip internal rotation    Hip external rotation    Knee flexion    Knee extension    Ankle dorsiflexion 4+ 4+  Ankle plantarflexion    Ankle inversion    Ankle eversion     (Blank rows = not tested)  LUMBAR SPECIAL TESTS:  Straight leg raise test: Positive and Slump test: Positive  FUNCTIONAL TESTS:  5 times sit to stand: 23.82 seconds, slight increase in pain 2 minute walk test: 356 feet  GAIT: Distance walked: 356 feet  Assistive device utilized: None Level of assistance: Complete Independence Comments: Pt demonstrates mild antalgic gait, symmetrical for most part. Increased level of pain reported at end of .  TREATMENT DATE:  10/05/2023  Evaluation: -ROM measured, Strength assessed, HEP prescribed, pt educated on prognosis, findings, and importance of HEP compliance if given.   Therapeutic Exercise: -Supine bridges 1 sets of 5 reps, 3 second holds, pt cued for pain free hip extension -PPT, 1 set of 10 reps, 3 second holds, pt cued for movement verbally and tactile  -LTR, 1 set of 10 reps bilaterally                                                                                                                                    PATIENT EDUCATION:  Education details: Pt was educated on findings of PT evaluation, prognosis, frequency of therapy visits and rationale, attendance policy, and HEP if given.   Person educated: Patient Education method: Explanation, Verbal cues, and Handouts Education comprehension: verbalized  understanding, returned demonstration, and needs further education  HOME EXERCISE PROGRAM: Access Code: [email or web address] URL: [email or web address] Date: 10/05/2023 Prepared by: Armond Bertin  Exercises - Supine Bridge  - 1 x daily - 7 x weekly - 3 sets - 10 reps - 3 hold - Supine Lower Trunk Rotation  - 1 x daily - 7 x weekly - 3 sets - 10 reps - Supine Posterior Pelvic Tilt  - 1 x daily - 7 x weekly - 3 sets - 10 reps - 3 hold  ASSESSMENT:  CLINICAL IMPRESSION: Patient is a 53 y.o. male who was seen today for physical therapy evaluation and treatment for M54.50 (ICD-10-CM) - Lumbar pain.   Patient demonstrates low back pain, decreased LE strength, abnormal gait, and decreased lumbar pain free ROM. Patient also demonstrates increased tightness of lumbar and thoracic paraspinals, specifically erector spinae group. Pt demonstrates mild difficulty with ambulation during today's session with decreased velocity noted. Patient also demonstrates slight increase in pain following . Patient requires minimal verbal cues for  completion of HEP exercises. Patient would benefit from skilled physical therapy for increased endurance with ambulation, increased LE strength, and decreased pain for improved gait quality, return to higher level of function with ADLs, and progress towards therapy goals.   OBJECTIVE IMPAIRMENTS: Abnormal gait, decreased endurance, decreased mobility, difficulty walking, decreased ROM, decreased strength, impaired tone, and pain.   ACTIVITY LIMITATIONS: carrying, lifting, bending, standing, squatting, stairs, and transfers  PARTICIPATION LIMITATIONS: meal prep, cleaning, laundry, shopping, community activity, occupation, and yard work  PERSONAL FACTORS: Time since onset of injury/illness/exacerbation and 1 comorbidity: traumatic MOI are also affecting patient's functional outcome.   REHAB POTENTIAL: Good  CLINICAL DECISION MAKING: Stable/uncomplicated  EVALUATION  COMPLEXITY: Low   GOALS: Goals reviewed with patient? No  SHORT TERM GOALS: Target date: 10/26/23  Pt will be independent with HEP in order to demonstrate participation in Physical Therapy POC.  Baseline: Goal status: INITIAL  2.  Pt will report 5/10 pain with mobility in order to demonstrate improved pain with ADLs.  Baseline:  Goal status: INITIAL  LONG TERM GOALS: Target date: 11/16/23  Pt will improve 5TSTS by at least 2.3 seconds in order to demonstrate improved functional strength to return to desired activities.  Baseline: see objective.  Goal status: INITIAL  2.  Pt will improve 2 MWT by 140 feet in order to demonstrate improved functional ambulatory capacity in community setting.  Baseline: see objective.  Goal status: INITIAL  3.  Pt will improve Modified Oswestry score by at least 6 points in order to demonstrate improved pain with functional goals and outcomes. Baseline: see objective.  Goal status: INITIAL  4.  Pt will report 3/10 pain with mobility in order to demonstrate reduced pain with ADLs lasting greater than 30 minutes.  Baseline: see objective.  Goal status: INITIAL   PLAN:  PT FREQUENCY: 2x/week  PT DURATION: 6 weeks  PLANNED INTERVENTIONS: 97110-Therapeutic exercises, 97530- Therapeutic activity, 97112- Neuromuscular re-education, 97535- Self Care, 82956- Manual therapy, [phone]- Gait training, Patient/Family education, Balance training, Stair training, Spinal manipulation, Spinal mobilization, Cryotherapy, and Moist heat.  PLAN FOR NEXT SESSION: review goals and HEP, progress core and LE strengthening, manual to erector spinae group   Armond Bertin, PT, DPT [facility] Office: [phone] 5:26 PM, 10/05/23

## 2023-10-06 ENCOUNTER — Other Ambulatory Visit (HOSPITAL_COMMUNITY)
Admission: RE | Admit: 2023-10-06 | Discharge: 2023-10-06 | Disposition: A | Discharge status: Home / Self Care | Source: Ambulatory Visit | Attending: Radiology | Admitting: Radiology

## 2023-10-06 ENCOUNTER — Ambulatory Visit
Admission: RE | Admit: 2023-10-06 | Discharge: 2023-10-06 | Disposition: A | Discharge status: Home / Self Care | Source: Ambulatory Visit | Attending: General Surgery | Admitting: General Surgery

## 2023-10-06 DIAGNOSIS — E041 Nontoxic single thyroid nodule: Secondary | ICD-10-CM | POA: Insufficient documentation

## 2023-10-10 ENCOUNTER — Telehealth: Payer: Self-pay | Admitting: Orthopedic Surgery

## 2023-10-10 LAB — CYTOLOGY - NON PAP

## 2023-10-10 NOTE — Telephone Encounter (Signed)
 I called and spoke with patient, advised that I wrote a note on 10/04/23 that has him covered till 11/15/23 with restrictions, he states that he wanted to make sure that's what he needed for his lawyer. Also I advised that he should be able to access this thru his my chart, and he states that he can see it.

## 2023-10-10 NOTE — Telephone Encounter (Signed)
 Patient said he went to [place] and got a note for work but that note will run out next week and he comes to see Dr. Sulema Endo in two weeks so he needs a note for work with the restrictions. He has to come back for the MRI review soon. [phone] , If you could send it to his mychart.

## 2023-10-17 ENCOUNTER — Other Ambulatory Visit: Payer: Self-pay | Admitting: Nurse Practitioner

## 2023-10-17 ENCOUNTER — Other Ambulatory Visit: Payer: Self-pay | Admitting: Orthopedic Surgery

## 2023-10-17 DIAGNOSIS — M5126 Other intervertebral disc displacement, lumbar region: Secondary | ICD-10-CM

## 2023-10-17 DIAGNOSIS — B351 Tinea unguium: Secondary | ICD-10-CM

## 2023-10-20 ENCOUNTER — Other Ambulatory Visit: Payer: Self-pay | Admitting: Orthopedic Surgery

## 2023-10-20 DIAGNOSIS — M5126 Other intervertebral disc displacement, lumbar region: Secondary | ICD-10-CM

## 2023-10-24 ENCOUNTER — Encounter (HOSPITAL_COMMUNITY): Payer: Self-pay

## 2023-10-24 NOTE — Therapy (Signed)
 [facility] Outpatient Rehabilitation at [facility] [address] [place], [place], [address] Phone: [phone]   Fax:  [phone]  Patient Details  Name: Barry Wood MRN: [id number] Date of Birth: 1971/02/02 Referring Provider:  No ref. provider found  Encounter Date: 10/24/2023  PHYSICAL THERAPY DISCHARGE SUMMARY  Visits from Start of Care: 0  Current functional level related to goals / functional outcomes: Pt has not returned.   Remaining deficits: Pt has not returned.   Education / Equipment: Pt has not returned.   Patient agrees to discharge. Patient goals were Pt has not returned.. Patient is being discharged due to not returning since the last visit.  Patient discharged due to moving therapy episode to [facility].   Armond Bertin, PT, DPT [facility] Office: [phone] 5:44 PM, 10/24/23   [facility] Outpatient Rehabilitation at [facility] [address] [place], [place], [address] Phone: [phone]   Fax:  [phone]

## 2023-10-25 ENCOUNTER — Encounter (HOSPITAL_COMMUNITY)

## 2023-10-27 ENCOUNTER — Encounter (HOSPITAL_COMMUNITY)

## 2023-10-31 ENCOUNTER — Encounter (HOSPITAL_COMMUNITY): Admitting: Physical Therapy

## 2023-11-02 ENCOUNTER — Ambulatory Visit (INDEPENDENT_AMBULATORY_CARE_PROVIDER_SITE_OTHER): Payer: Worker's Compensation | Admitting: Orthopedic Surgery

## 2023-11-02 DIAGNOSIS — M545 Low back pain, unspecified: Secondary | ICD-10-CM

## 2023-11-02 NOTE — Progress Notes (Signed)
 Orthopedic Spine Surgery Office Note   Assessment: Patient is a 53 y.o. 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. Pain slowly improving     Plan: -Patient has tried PT, prednisone , gabapentin , tizanidine   -Continue to work with physical therapy and do the home exercise program -Will hold off on injection at this time since he is noticing improvement with PT -Patient should return to office in 6 weeks, x-rays at next visit: none     Patient expressed understanding of the plan and all questions were answered to the patient's satisfaction.    ___________________________________________________________________________     History:   Patient is a 53 y.o. male who presents today for follow up on his lumbar spine.  Patient continues to have lower lumbar back pain.  He said he has noticed improvement since starting his physical physical therapy.  He does not have any pain radiating to either lower extremity.  He has not noticed any new symptoms since he was last seen in the office.   Treatments tried: PT, prednisone , gabapentin , tizanidine     Physical Exam:   General: no acute distress, appears stated age Neurologic: alert, answering questions appropriately, following commands Respiratory: unlabored breathing on room air, symmetric chest rise Psychiatric: appropriate affect, normal cadence to speech     MSK (spine):   -Strength exam                                                   Left                  Right EHL                              5/5                  5/5 TA                                 5/5                  5/5 GSC                             5/5                  5/5 Knee extension            5/5                  5/5 Hip flexion                    5/5                  5/5   -Sensory exam                           Sensation intact to light touch in L3-S1 nerve distributions of bilateral lower extremities   Imaging: XRs of the  lumbar spine from 09/28/2023 were previously independently reviewed and interpreted, showing no significant degenerative changes.  No fracture or dislocation seen.  No evidence of instability  on flexion/extension views.   MRI of the lumbar spine from 09/18/2023 was previously independently reviewed and interpreted, showing an annular tear at L5/S1 with a central disc herniation.  No foraminal or lateral recess or central stenosis seen.     Patient name: Barry Wood Patient MRN: [id number] Date of visit: 11/02/23

## 2023-11-03 ENCOUNTER — Encounter (HOSPITAL_COMMUNITY)

## 2023-11-07 ENCOUNTER — Encounter (HOSPITAL_COMMUNITY)

## 2023-11-09 ENCOUNTER — Encounter (HOSPITAL_COMMUNITY)

## 2023-11-14 ENCOUNTER — Encounter (HOSPITAL_COMMUNITY)

## 2023-11-16 ENCOUNTER — Encounter (HOSPITAL_COMMUNITY)

## 2023-11-21 ENCOUNTER — Encounter (HOSPITAL_COMMUNITY)

## 2023-11-23 ENCOUNTER — Encounter (HOSPITAL_COMMUNITY)

## 2023-11-28 ENCOUNTER — Encounter (HOSPITAL_COMMUNITY)

## 2023-12-04 DIAGNOSIS — H6093 Unspecified otitis externa, bilateral: Secondary | ICD-10-CM | POA: Diagnosis not present

## 2023-12-04 DIAGNOSIS — H6503 Acute serous otitis media, bilateral: Secondary | ICD-10-CM | POA: Diagnosis not present

## 2023-12-14 ENCOUNTER — Ambulatory Visit (INDEPENDENT_AMBULATORY_CARE_PROVIDER_SITE_OTHER): Payer: Worker's Compensation | Admitting: Orthopedic Surgery

## 2023-12-14 DIAGNOSIS — M51369 Other intervertebral disc degeneration, lumbar region without mention of lumbar back pain or lower extremity pain: Secondary | ICD-10-CM | POA: Diagnosis not present

## 2023-12-14 NOTE — Progress Notes (Signed)
 Orthopedic Spine Surgery Office Note   Assessment: Patient is a 53 y.o. 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     Plan: -Patient has tried PT, prednisone , gabapentin , tizanidine   -Recommended L5/S1 ESI's and additional nonoperative treatment -Told him that I do not see any operative intervention that would provide him with predictable relief, so not recommending that now or in the future for this issue -I told him that if he does not get any better with the ESI, then he would either have to return to work or seek different employment as there are not any other nonoperative treatments that have been tried and he has been dealing with this for greater than 6 months -Patient should return to office in 5 weeks, x-rays at next visit: none     Patient expressed understanding of the plan and all questions were answered to the patient's satisfaction.    ___________________________________________________________________________     History:   Patient is a 53 y.o. male who presents today for follow up on his lumbar spine.  Patient continues notes low back pain.  He has no radicular symptoms.  His pain was getting better with PT but then as soon as he stopped, his pain returned to about the same level.  He has not noticed any changes in his symptoms since he was last seen here.   Treatments tried: PT, prednisone , gabapentin , tizanidine      Physical Exam:   General: no acute distress, appears stated age Neurologic: alert, answering questions appropriately, following commands Respiratory: unlabored breathing on room air, symmetric chest rise Psychiatric: appropriate affect, normal cadence to speech     MSK (spine):   -Strength exam                                                   Left                  Right EHL                              5/5                  5/5 TA                                 5/5                  5/5 GSC                              5/5                  5/5 Knee extension            5/5                  5/5 Hip flexion                    5/5                  5/5   -Sensory exam  Sensation intact to light touch in L3-S1 nerve distributions of bilateral lower extremities   Imaging: XRs of the lumbar spine from 09/28/2023 were previously independently reviewed and interpreted, showing no significant degenerative changes.  No fracture or dislocation seen.  No evidence of instability on flexion/extension views.   MRI of the lumbar spine from 09/18/2023 was previously independently reviewed and interpreted, showing an annular tear at L5/S1 with a central disc herniation.  No foraminal or lateral recess or central stenosis seen.     Patient name: Barry Wood Patient MRN: [id number] Date of visit: 12/14/23

## 2023-12-18 ENCOUNTER — Other Ambulatory Visit: Payer: Self-pay | Admitting: Nurse Practitioner

## 2023-12-18 DIAGNOSIS — N529 Male erectile dysfunction, unspecified: Secondary | ICD-10-CM

## 2023-12-20 DIAGNOSIS — H6091 Unspecified otitis externa, right ear: Secondary | ICD-10-CM | POA: Diagnosis not present

## 2024-01-10 ENCOUNTER — Ambulatory Visit (INDEPENDENT_AMBULATORY_CARE_PROVIDER_SITE_OTHER): Payer: Worker's Compensation | Admitting: Physical Medicine and Rehabilitation

## 2024-01-10 ENCOUNTER — Other Ambulatory Visit: Payer: Self-pay

## 2024-01-10 VITALS — BP 124/78

## 2024-01-10 DIAGNOSIS — M5416 Radiculopathy, lumbar region: Secondary | ICD-10-CM

## 2024-01-10 MED ORDER — METHYLPREDNISOLONE ACETATE 40 MG/ML IJ SUSP
40.0000 mg | Freq: Once | INTRAMUSCULAR | Status: AC
  Administered 2024-01-10: 40 mg

## 2024-01-10 NOTE — Patient Instructions (Signed)

## 2024-01-10 NOTE — Procedures (Signed)
 Lumbar Epidural Steroid Injection - Interlaminar Approach with Fluoroscopic Guidance  Patient: Barry Wood      Date of Birth: 17-Jan-1971 MRN: [id number] PCP: Elnor Lauraine BRAVO, NP      Visit Date: 01/10/2024   Universal Protocol:     Consent Given By: the patient  Position: PRONE  Additional Comments: Vital signs were monitored before and after the procedure. Patient was prepped and draped in the usual sterile fashion. The correct patient, procedure, and site was verified.   Injection Procedure Details:   Procedure diagnoses: Lumbar radiculopathy [M54.16]   Meds Administered:  Meds ordered this encounter  Medications   methylPREDNISolone  acetate (DEPO-MEDROL ) injection 40 mg     Laterality: Right  Location/Site:  L5-S1  Needle: 3.5 in., 20 ga. Tuohy  Needle Placement: Paramedian epidural  Findings:   -Comments: Excellent flow of contrast into the epidural space.  Procedure Details: Using a paramedian approach from the side mentioned above, the region overlying the inferior lamina was localized under fluoroscopic visualization and the soft tissues overlying this structure were infiltrated with 4 ml. of 1% Lidocaine without Epinephrine. The Tuohy needle was inserted into the epidural space using a paramedian approach.   The epidural space was localized using loss of resistance along with counter oblique bi-planar fluoroscopic views.  After negative aspirate for air, blood, and CSF, a 2 ml. volume of Isovue-250 was injected into the epidural space and the flow of contrast was observed. Radiographs were obtained for documentation purposes.    The injectate was administered into the level noted above.   Additional Comments:  The patient tolerated the procedure well Dressing: 2 x 2 sterile gauze and Band-Aid    Post-procedure details: Patient was observed during the procedure. Post-procedure instructions were reviewed.  Patient left the clinic in stable  condition.

## 2024-01-10 NOTE — Progress Notes (Signed)
 LASTER APPLING - 53 y.o. male MRN [id number]  Date of birth: 11/23/1970  Office Visit Note: Visit Date: 01/10/2024 PCP: Elnor Lauraine BRAVO, NP Referred by: Georgina Ozell LABOR, MD  Subjective: Chief Complaint  Patient presents with   Lower Back - Pain    Patient in today with pain in the left lower back.  He states that his pain level today is a 6 no allergies to Dye, not on any Blood Thinners.   HPI:  Barry Wood is a 53 y.o. male who comes in today at the request of Dr. Ozell Georgina for planned Right L5-S1 Lumbar Interlaminar epidural steroid injection with fluoroscopic guidance.  The patient has failed conservative care including home exercise, medications, time and activity modification.  This injection will be diagnostic and hopefully therapeutic.  Please see requesting physician notes for further details and justification.   ROS Otherwise per HPI.  Assessment & Plan: Visit Diagnoses:    ICD-10-CM   1. Lumbar radiculopathy  M54.16 XR C-ARM NO REPORT    Epidural Steroid injection    methylPREDNISolone  acetate (DEPO-MEDROL ) injection 40 mg      Plan: No additional findings.   Meds & Orders:  Meds ordered this encounter  Medications   methylPREDNISolone  acetate (DEPO-MEDROL ) injection 40 mg    Orders Placed This Encounter  Procedures   XR C-ARM NO REPORT   Epidural Steroid injection    Follow-up: Return for visit to requesting provider as needed.   Procedures: No procedures performed  Lumbar Epidural Steroid Injection - Interlaminar Approach with Fluoroscopic Guidance  Patient: Barry Wood      Date of Birth: [birth year] MRN: [id number] PCP: Elnor Lauraine BRAVO, NP      Visit Date: 01/10/2024   Universal Protocol:     Consent Given By: the patient  Position: PRONE  Additional Comments: Vital signs were monitored before and after the procedure. Patient was prepped and draped in the usual sterile fashion. The correct patient, procedure, and site was  verified.   Injection Procedure Details:   Procedure diagnoses: Lumbar radiculopathy [M54.16]   Meds Administered:  Meds ordered this encounter  Medications   methylPREDNISolone  acetate (DEPO-MEDROL ) injection 40 mg     Laterality: Right  Location/Site:  L5-S1  Needle: 3.5 in., 20 ga. Tuohy  Needle Placement: Paramedian epidural  Findings:   -Comments: Excellent flow of contrast into the epidural space.  Procedure Details: Using a paramedian approach from the side mentioned above, the region overlying the inferior lamina was localized under fluoroscopic visualization and the soft tissues overlying this structure were infiltrated with 4 ml. of 1% Lidocaine without Epinephrine. The Tuohy needle was inserted into the epidural space using a paramedian approach.   The epidural space was localized using loss of resistance along with counter oblique bi-planar fluoroscopic views.  After negative aspirate for air, blood, and CSF, a 2 ml. volume of Isovue-250 was injected into the epidural space and the flow of contrast was observed. Radiographs were obtained for documentation purposes.    The injectate was administered into the level noted above.   Additional Comments:  The patient tolerated the procedure well Dressing: 2 x 2 sterile gauze and Band-Aid    Post-procedure details: Patient was observed during the procedure. Post-procedure instructions were reviewed.  Patient left the clinic in stable condition.   Clinical History: CLINICAL DATA:  Chronic low back pain.  Lifting injury in January.   EXAM: MRI LUMBAR SPINE WITHOUT CONTRAST   TECHNIQUE: Multiplanar, multisequence  MR imaging of the lumbar spine was performed. No intravenous contrast was administered.   COMPARISON:  Lumbar spine radiographs 06/15/2023   FINDINGS: Segmentation:  Standard.   Alignment:  Trace retrolisthesis of L5 on S1.   Vertebrae: No fracture or suspicious marrow lesion. Mild Modic type 1  endplate changes anteriorly at T11-12 and L1-2. Moderate left and minimal right facet edema at L4-5.   Conus medullaris and cauda equina: Conus extends to the L1-2 level. Conus and cauda equina appear normal.   Paraspinal and other soft tissues: Right renal sinus cysts with no routine follow-up imaging required.   Disc levels:   T12-L1: Negative.   L1-2: Mild facet hypertrophy without disc herniation or stenosis.   L2-3: Disc desiccation and mild disc space narrowing. Mild disc bulging eccentric to the left and mild facet hypertrophy without stenosis.   L3-4: Disc bulging and mild-to-moderate facet hypertrophy without stenosis.   L4-5: Disc bulging and mild-to-moderate right and severe left facet and ligamentum flavum hypertrophy result in mild right and mild-to-moderate left neural foraminal stenosis without spinal stenosis.   L5-S1: Disc desiccation and slight disc space narrowing. Small central disc protrusion with annular fissure, mild disc bulging, and mild facet and ligamentum flavum hypertrophy without significant stenosis.   IMPRESSION: 1. Multilevel lumbar disc and facet degeneration, most notable at L4-5 where there is mild-to-moderate left and mild right neural foraminal stenosis. 2. Moderate left facet edema at L4-5 which may be a source of back pain.     Electronically Signed   By: Dasie Hamburg M.D.   On: 09/20/2023 15:20     Objective:  VS:  HT:    WT:   BMI:     BP:124/78  HR: bpm  TEMP: ( )  RESP:  Physical Exam Vitals and nursing note reviewed.  Constitutional:      General: He is not in acute distress.    Appearance: Normal appearance. He is not ill-appearing.  HENT:     Head: Normocephalic and atraumatic.     Right Ear: External ear normal.     Left Ear: External ear normal.     Nose: No congestion.  Eyes:     Extraocular Movements: Extraocular movements intact.  Cardiovascular:     Rate and Rhythm: Normal rate.     Pulses: Normal  pulses.  Pulmonary:     Effort: Pulmonary effort is normal. No respiratory distress.  Abdominal:     General: There is no distension.     Palpations: Abdomen is soft.  Musculoskeletal:        General: No tenderness or signs of injury.     Cervical back: Neck supple.     Right lower leg: No edema.     Left lower leg: No edema.     Comments: Patient has good distal strength without clonus.  Skin:    Findings: No erythema or rash.  Neurological:     General: No focal deficit present.     Mental Status: He is alert and oriented to person, place, and time.     Sensory: No sensory deficit.     Motor: No weakness or abnormal muscle tone.     Coordination: Coordination normal.  Psychiatric:        Mood and Affect: Mood normal.        Behavior: Behavior normal.      Imaging: XR C-ARM NO REPORT Result Date: 01/10/2024 Please see Notes tab for imaging impression.

## 2024-01-12 ENCOUNTER — Other Ambulatory Visit: Payer: Self-pay | Admitting: Nurse Practitioner

## 2024-01-12 DIAGNOSIS — N529 Male erectile dysfunction, unspecified: Secondary | ICD-10-CM

## 2024-01-24 ENCOUNTER — Ambulatory Visit (INDEPENDENT_AMBULATORY_CARE_PROVIDER_SITE_OTHER): Payer: Worker's Compensation | Admitting: Orthopedic Surgery

## 2024-01-24 DIAGNOSIS — M545 Low back pain, unspecified: Secondary | ICD-10-CM

## 2024-01-24 NOTE — Progress Notes (Signed)
 Orthopedic Spine Surgery Office Note   Assessment: Patient is a 53 y.o. 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  It would expose him to potential for complication and risk so I did not recommend any surgical intervention at this time -Since he is still having symptoms and is unable to return to work, referred him for a FCE.  Will provide more permanent restrictions for him going forward after that FCE is provided. I will provide my recommendations to his current employer at that time and he can then decide how he would like to proceed with his employment going forward -Patient should return to office in 4 weeks, x-rays at next visit: none     Patient expressed understanding of the plan and all questions were answered to the patient's satisfaction.    ___________________________________________________________________________     History:   Patient is a 53 y.o. male who presents today for follow up on his lumbar spine.  After our last visit, patient got a ESI with Dr. Eldonna.  He did not notice any improvement whatsoever after the injection.  He did not even notice improvement for a day.  He has not noticed any changes since he was last seen.  He continues to have low back pain with no radicular symptoms.   Treatments tried: PT, prednisone , gabapentin , tizanidine , lumbar ESI     Physical Exam:   General: no acute distress, appears stated age Neurologic: alert, answering questions appropriately, following commands Respiratory: unlabored breathing on room air, symmetric chest rise Psychiatric: appropriate affect, normal cadence to speech     MSK (spine):    -Strength exam                                                   Left                  Right EHL                              5/5                  5/5 TA                                 5/5                  5/5 GSC                             5/5                  5/5 Knee extension            5/5                  5/5 Hip flexion  5/5                  5/5   -Sensory exam                           Sensation intact to light touch in L3-S1 nerve distributions of bilateral lower extremities   Imaging: XRs of the lumbar spine from 09/28/2023 were previously independently reviewed and interpreted, showing no significant degenerative changes.  No fracture or dislocation seen.  No evidence of instability on flexion/extension views.   MRI of the lumbar spine from 09/18/2023 was previously independently reviewed and interpreted, showing an annular tear at L5/S1 with a central disc herniation.  No foraminal or lateral recess or central stenosis seen.     Patient name: Barry Wood Patient MRN: [id number] Date of visit: 01/24/24

## 2024-02-23 ENCOUNTER — Encounter: Payer: 59 | Admitting: Nurse Practitioner

## 2024-02-28 ENCOUNTER — Ambulatory Visit (INDEPENDENT_AMBULATORY_CARE_PROVIDER_SITE_OTHER): Payer: Worker's Compensation | Admitting: Orthopedic Surgery

## 2024-02-28 DIAGNOSIS — M545 Low back pain, unspecified: Secondary | ICD-10-CM | POA: Diagnosis not present

## 2024-02-28 NOTE — Progress Notes (Signed)
 Orthopedic Spine Surgery Office Note   Assessment: Patient is a 53 y.o. 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  I do not see any surgery that would provide him with reliable relief.  I also do not see his pain getting better since it has not over 10 months.  After our last visit, I had him do an FCE.  I reviewed the assessment and agree with the recommended weight restrictions.  A work note was provided to him with permanent restrictions -Patient should return to office on an as-needed basis     Patient expressed understanding of the plan and all questions were answered to the patient's satisfaction.    ___________________________________________________________________________     History:   Patient is a 53 y.o. male who presents today for follow up on his lumbar spine.  Patient continues to have low back pain.  No radicular pain.  Has not noticed any changes in his symptoms.  He completed the FCE and comes in today to follow-up on that.   Treatments tried: PT, prednisone , gabapentin , tizanidine , lumbar ESI     Physical Exam:   General: no acute distress, appears stated age Neurologic: alert, answering questions appropriately, following commands Respiratory: unlabored breathing on room air, symmetric chest rise Psychiatric: appropriate affect, normal cadence to speech     MSK (spine):   -Strength exam                                                   Left                  Right EHL                              5/5                  5/5 TA                                 5/5                  5/5 GSC                             5/5                  5/5 Knee extension            5/5                  5/5 Hip flexion                    5/5                   5/5   -Sensory exam                           Sensation intact to light touch in L3-S1 nerve distributions of  bilateral lower extremities   Imaging: XRs of the lumbar spine from 09/28/2023 were previously independently reviewed and interpreted, showing no significant degenerative changes.  No fracture or dislocation seen.  No evidence of instability on flexion/extension views.   MRI of the lumbar spine from 09/18/2023 was previously independently reviewed and interpreted, showing an annular tear at L5/S1 with a central disc herniation.  No foraminal or lateral recess or central stenosis seen.     Patient name: Barry Wood Patient MRN: [id number] Date of visit: 02/28/24

## 2024-03-25 ENCOUNTER — Encounter: Payer: Self-pay | Admitting: Radiology

## 2024-03-29 ENCOUNTER — Other Ambulatory Visit: Payer: Self-pay | Admitting: Family

## 2024-03-29 DIAGNOSIS — N529 Male erectile dysfunction, unspecified: Secondary | ICD-10-CM

## 2024-04-08 ENCOUNTER — Telehealth: Payer: Self-pay

## 2024-04-08 NOTE — Telephone Encounter (Signed)
 Copied from CRM #[id number]. Topic: Clinical - Prescription Issue >> Apr 08, 2024  2:10 PM Alfonso ORN wrote: Reason for CRM: pt calling to f/u on rx request Sildenafil  Citrate 100 MG  No update since request on 11/7 . Please contact pt to advise

## 2024-04-10 ENCOUNTER — Other Ambulatory Visit: Payer: Self-pay

## 2024-04-10 DIAGNOSIS — N529 Male erectile dysfunction, unspecified: Secondary | ICD-10-CM

## 2024-04-10 MED ORDER — SILDENAFIL CITRATE 100 MG PO TABS: 100.0000 mg | ORAL_TABLET | ORAL | 2 refills | Status: DC | PRN

## 2024-04-10 NOTE — Telephone Encounter (Signed)
Medication has been send in

## 2024-05-02 ENCOUNTER — Encounter: Admitting: Nurse Practitioner

## 2024-05-03 ENCOUNTER — Encounter: Admitting: Nurse Practitioner

## 2024-06-14 ENCOUNTER — Other Ambulatory Visit: Payer: Self-pay | Admitting: Nurse Practitioner

## 2024-06-14 DIAGNOSIS — N529 Male erectile dysfunction, unspecified: Secondary | ICD-10-CM

## 2024-07-05 ENCOUNTER — Encounter: Admitting: Nurse Practitioner
